# Patient Record
Sex: Female | Born: 1960 | Race: White | Hispanic: No | Marital: Married | Smoking: Never smoker
Health system: Southern US, Community
[De-identification: ages and names within clinical notes are randomized; demographics above are authoritative.]

## PROBLEM LIST (undated history)

## (undated) DIAGNOSIS — E079 Disorder of thyroid, unspecified: Secondary | ICD-10-CM

## (undated) HISTORY — PX: THYROID SURGERY: SHX805

## (undated) HISTORY — PX: ANKLE SURGERY: SHX546

## (undated) HISTORY — PX: TUBAL LIGATION: SHX77

---

## 2008-10-02 ENCOUNTER — Observation Stay (HOSPITAL_COMMUNITY): Admission: EM | Admit: 2008-10-02 | Discharge: 2008-10-03 | Payer: Self-pay | Admitting: Emergency Medicine

## 2011-05-01 NOTE — Op Note (Signed)
NAMETIFFNY, GEMMER NO.:  [id number]   MEDICAL RECORD NO.:  [id number]          PATIENT TYPE:  INP   LOCATION:  5011                         FACILITY:  [facility]   PHYSICIAN:  Harvie Junior, M.D.   DATE OF BIRTH:  Apr 15, 1961   DATE OF PROCEDURE:  10/03/2008  DATE OF DISCHARGE:  10/03/2008                               OPERATIVE REPORT   This is a 50 year old female on the Orthopedic Surgery Service.   PREOPERATIVE DIAGNOSIS:  Severely comminuted distal tibia fracture with  medial malleolus component, posterior malleolus component, and lateral  malleolar component.   POSTOPERATIVE DIAGNOSIS:  Severely comminuted distal tibia fracture with  medial malleolus component posterior malleolus component, and lateral  malleolar component.   PROCEDURE:  Open reduction and internal fixation of the trimalleolar  ankle fracture with fixation of the medial and lateral malleolus.   SURGEON:  Harvie Junior, MD   ASSISTANT:  Marshia Ly, PA   ANESTHESIA:  General.   BRIEF HISTORY:  Ms. Legendre is a 50 year old female with a long history  of tripping off her porch, suffered a severely comminuted distal ankle  fracture.  She was seen in the ER where they did an __________ close  reduction.  X-rays pre and post were evaluated and showed that she did  have both medial malleolus, lateral malleolus, and a posterior malleolar  __________, so she was taken to the operating room for open reduction  __________.   PROCEDURE:  The patient was taken to the operating room.  After adequate  anesthesia obtained with general anesthetic, the patient was placed  supine on the operating table.  The left leg was prepped and draped in  usual sterile fashion.  Following this, routine evaluation of the leg  revealed there was obvious lateral malleolus fracture.  We tried to do a  manipulative closed reduction and we had very difficult time getting it  reduced.  There was like a coronal strip  of this lateral malleolus  __________ malleolus and it was very mobile, really could not get a good  reduction.  Went medially and did an anatomic reduction of the medial  malleolus, came back laterally and were able to get an anatomic  reduction of lateral malleolus, posterior malleolus came in line at this  point. We used an 8-hole, 1/3 tubular plate proximally and paralleled  __________ 15 mm x 1.0 screws on the medial side.  Again, once we had  fixation there, we ultimately fixed the lateral malleolus and then under  fluoroscopic imaging looked at the posterior malleolus and it was about  20% of the joint, but ultimately we were able to come down low and held  it reduced especially with the extension of the great toe.  At this  point, the wounds were copiously  and thoroughly irrigated and suctioned  dried.  The skin was closed with a combination of 0 and 2-0 Vicryl and  skin staples.  The sterile compression dressing was applied as well as a  U and posterior splint.  The splint that came all the way out so as  to  hold the great toe in extension.  She was then taken to recovery and was  noted to be in satisfactory condition.  Fluoro was used throughout the case to check the reduction and as well  as screw lengths.  At this point, the patient was taken to the recovery  and she was noted to be in satisfactory condition.   ESTIMATED BLOOD LOSS:  __________.      Harvie Junior, M.D.  Electronically Signed     JLG/MEDQ  D:  10/03/2008  T:  10/03/2008  Job:  [id number]   cc:   Harvie Junior, M.D.

## 2011-06-15 DIAGNOSIS — M542 Cervicalgia: Secondary | ICD-10-CM | POA: Insufficient documentation

## 2011-09-18 LAB — CBC
Hemoglobin: 12.3
MCHC: 33.8
MCV: 90.1
Platelets: 333
RBC: 4.06
RDW: 13.3
WBC: 10.4

## 2011-09-18 LAB — BASIC METABOLIC PANEL
BUN: 14
CO2: 24
Chloride: 105
Creatinine, Ser: 0.74
Sodium: 137

## 2013-09-02 DIAGNOSIS — M79609 Pain in unspecified limb: Secondary | ICD-10-CM | POA: Insufficient documentation

## 2013-09-21 DIAGNOSIS — G5600 Carpal tunnel syndrome, unspecified upper limb: Secondary | ICD-10-CM | POA: Insufficient documentation

## 2014-06-28 DIAGNOSIS — M546 Pain in thoracic spine: Secondary | ICD-10-CM | POA: Insufficient documentation

## 2015-02-01 DIAGNOSIS — R9431 Abnormal electrocardiogram [ECG] [EKG]: Secondary | ICD-10-CM | POA: Insufficient documentation

## 2015-12-28 DIAGNOSIS — M5416 Radiculopathy, lumbar region: Secondary | ICD-10-CM | POA: Insufficient documentation

## 2016-02-15 DIAGNOSIS — R519 Headache, unspecified: Secondary | ICD-10-CM | POA: Insufficient documentation

## 2016-12-07 DIAGNOSIS — M79605 Pain in left leg: Secondary | ICD-10-CM | POA: Insufficient documentation

## 2016-12-07 DIAGNOSIS — R202 Paresthesia of skin: Secondary | ICD-10-CM | POA: Insufficient documentation

## 2017-02-26 DIAGNOSIS — E213 Hyperparathyroidism, unspecified: Secondary | ICD-10-CM

## 2017-02-26 DIAGNOSIS — E559 Vitamin D deficiency, unspecified: Secondary | ICD-10-CM

## 2017-02-26 DIAGNOSIS — I10 Essential (primary) hypertension: Secondary | ICD-10-CM

## 2017-02-26 HISTORY — DX: Hypercalcemia: E83.52

## 2017-02-26 HISTORY — DX: Hyperparathyroidism, unspecified: E21.3

## 2017-02-26 HISTORY — DX: Vitamin D deficiency, unspecified: E55.9

## 2017-02-26 HISTORY — DX: Essential (primary) hypertension: I10

## 2018-05-23 DIAGNOSIS — R252 Cramp and spasm: Secondary | ICD-10-CM | POA: Insufficient documentation

## 2018-09-19 DIAGNOSIS — M545 Low back pain, unspecified: Secondary | ICD-10-CM | POA: Insufficient documentation

## 2020-02-08 DIAGNOSIS — M51369 Other intervertebral disc degeneration, lumbar region without mention of lumbar back pain or lower extremity pain: Secondary | ICD-10-CM | POA: Insufficient documentation

## 2020-02-08 DIAGNOSIS — M5136 Other intervertebral disc degeneration, lumbar region: Secondary | ICD-10-CM

## 2020-02-08 HISTORY — DX: Other intervertebral disc degeneration, lumbar region without mention of lumbar back pain or lower extremity pain: M51.369

## 2020-02-08 HISTORY — DX: Other intervertebral disc degeneration, lumbar region: M51.36

## 2020-09-20 DIAGNOSIS — U071 COVID-19: Secondary | ICD-10-CM | POA: Insufficient documentation

## 2020-09-27 ENCOUNTER — Encounter: Payer: Self-pay | Admitting: Cardiology

## 2020-09-27 ENCOUNTER — Ambulatory Visit (INDEPENDENT_AMBULATORY_CARE_PROVIDER_SITE_OTHER): Payer: Commercial Managed Care - PPO | Admitting: Cardiology

## 2020-09-27 ENCOUNTER — Other Ambulatory Visit: Payer: Self-pay

## 2020-09-27 ENCOUNTER — Ambulatory Visit (INDEPENDENT_AMBULATORY_CARE_PROVIDER_SITE_OTHER): Payer: Commercial Managed Care - PPO

## 2020-09-27 VITALS — BP 130/72 | HR 44 | Ht 60.0 in | Wt 234.0 lb

## 2020-09-27 DIAGNOSIS — R600 Localized edema: Secondary | ICD-10-CM | POA: Diagnosis not present

## 2020-09-27 DIAGNOSIS — R079 Chest pain, unspecified: Secondary | ICD-10-CM

## 2020-09-27 DIAGNOSIS — I10 Essential (primary) hypertension: Secondary | ICD-10-CM

## 2020-09-27 DIAGNOSIS — R42 Dizziness and giddiness: Secondary | ICD-10-CM

## 2020-09-27 MED ORDER — NITROGLYCERIN 0.4 MG SL SUBL: 0.4000 mg | SUBLINGUAL_TABLET | SUBLINGUAL | 11 refills | Status: DC | PRN

## 2020-09-27 MED ORDER — POTASSIUM CHLORIDE CRYS ER 20 MEQ PO TBCR: EXTENDED_RELEASE_TABLET | ORAL | 3 refills | Status: DC

## 2020-09-27 MED ORDER — FUROSEMIDE 20 MG PO TABS: ORAL_TABLET | ORAL | 3 refills | Status: DC

## 2020-09-27 MED ORDER — NITROGLYCERIN 0.4 MG SL SUBL: 0.4000 mg | SUBLINGUAL_TABLET | SUBLINGUAL | 11 refills | Status: AC | PRN

## 2020-09-27 NOTE — Progress Notes (Signed)
Cardiology Office Note:    Date:  09/27/2020   ID:  Cynthia Archer, DOB 1961-02-01, MRN [id number]  PCP:  Imagene Riches, NP  Cardiologist:  Berniece Salines, DO  Electrophysiologist:  None   Referring MD: No ref. provider found   " I have been short of breath"   History of Present Illness:    Cynthia Archer is a 59 y.o. female with a hx of hypertension, obesity, presents today to establish cardiac care.  The patient initially presented to [facility] to be evaluated for Dobutamine stress echo. On presentation to the hospital she was noted to be in Mobitz I second degree AV block, at that time she also reported that she was experiencing some shortness of breath and lightheadedness.   The patient is here to establish cardiac care.  The patient tells me that she has been experiencing intermittent chest pressure.  She reports that she is experiencing this chest pressure on exertion.  Does not matter what she is doing walking to the bathroom she will feel this.  When she sits down it improves her stress.  In terms of her shortness of breath she notes that moving around worsen her shortness of breath as well.  She has had recently increasing bilateral leg edema since her PCP stopped her diuretics.  In addition she reports that lightheadedness and dizziness has been a problem for her.  She is concerned about this.   She is not experiencing any chest pain at this time.  Past Medical History:  Diagnosis Date  . Essential hypertension 02/26/2017  . Hypercalcemia 02/26/2017  . Hyperparathyroidism ([place]) 02/26/2017  . Vitamin D deficiency 02/26/2017    Past Surgical History:  Procedure Laterality Date  . ANKLE SURGERY    . THYROID SURGERY    . TUBAL LIGATION      Current Medications: Current Meds  Medication Sig  . buPROPion (WELLBUTRIN SR) 150 MG 12 hr tablet   . levothyroxine (SYNTHROID) 50 MCG tablet Take 50 mcg by mouth daily.  Marland Kitchen olmesartan (BENICAR) 20 MG tablet Take 20 mg by mouth  daily.  Marland Kitchen omeprazole (PRILOSEC) 40 MG capsule Take 40 mg by mouth daily.  . [DISCONTINUED] D3-50 1.25 MG (50000 UT) capsule Take by mouth.  . [DISCONTINUED] rosuvastatin (CRESTOR) 10 MG tablet Take 10 mg by mouth at bedtime.     Allergies:   Patient has no known allergies.   Social History   Socioeconomic History  . Marital status: Married    Spouse name: Not on file  . Number of children: Not on file  . Years of education: Not on file  . Highest education level: Not on file  Occupational History  . Not on file  Tobacco Use  . Smoking status: Never Smoker  . Smokeless tobacco: Never Used  Substance and Sexual Activity  . Alcohol use: Not Currently  . Drug use: Never  . Sexual activity: Not on file  Other Topics Concern  . Not on file  Social History Narrative  . Not on file   Social Determinants of Health   Financial Resource Strain:   . Difficulty of Paying Living Expenses: Not on file  Food Insecurity:   . Worried About Charity fundraiser in the Last Year: Not on file  . Ran Out of Food in the Last Year: Not on file  Transportation Needs:   . Lack of Transportation (Medical): Not on file  . Lack of Transportation (Non-Medical): Not on file  Physical Activity:   . Days of Exercise per Week: Not on file  . Minutes of Exercise per Session: Not on file  Stress:   . Feeling of Stress : Not on file  Social Connections:   . Frequency of Communication with Friends and Family: Not on file  . Frequency of Social Gatherings with Friends and Family: Not on file  . Attends Religious Services: Not on file  . Active Member of Clubs or Organizations: Not on file  . Attends Archivist Meetings: Not on file  . Marital Status: Not on file     Family History: The patient's family history includes Alzheimer's disease in her maternal grandmother; COPD in her father; Heart disease in her mother; Kidney failure in her mother; Lung cancer in her father, maternal  grandfather, and paternal grandfather.  ROS:   Review of Systems  Constitution: Negative for decreased appetite, fever and weight gain.  HENT: Negative for congestion, ear discharge, hoarse voice and sore throat.   Eyes: Negative for discharge, redness, vision loss in right eye and visual halos.  Cardiovascular: Negative for chest pain, dyspnea on exertion, leg swelling, orthopnea and palpitations.  Respiratory: Negative for cough, hemoptysis, shortness of breath and snoring.   Endocrine: Negative for heat intolerance and polyphagia.  Hematologic/Lymphatic: Negative for bleeding problem. Does not bruise/bleed easily.  Skin: Negative for flushing, nail changes, rash and suspicious lesions.  Musculoskeletal: Negative for arthritis, joint pain, muscle cramps, myalgias, neck pain and stiffness.  Gastrointestinal: Negative for abdominal pain, bowel incontinence, diarrhea and excessive appetite.  Genitourinary: Negative for decreased libido, genital sores and incomplete emptying.  Neurological: Negative for brief paralysis, focal weakness, headaches and loss of balance.  Psychiatric/Behavioral: Negative for altered mental status, depression and suicidal ideas.  Allergic/Immunologic: Negative for HIV exposure and persistent infections.    EKGs/Labs/Other Studies Reviewed:    The following studies were reviewed today:   EKG:  The ekg ordered today demonstrates sinus bradycardia, heart rate 52 bpm with Mobitz type I AV block and rare PAC.   Echocardiogram done during hospital today shows EF 60 to 65%.   Recent Labs: No results found for requested labs within last 8760 hours.  Recent Lipid Panel No results found for: CHOL, TRIG, HDL, CHOLHDL, VLDL, LDLCALC, LDLDIRECT  Physical Exam:    VS:  BP 130/72 (BP Location: Left Arm, Patient Position: Sitting, Cuff Size: Normal)   Pulse (!) 44   Ht 5' (1.524 m)   Wt 234 lb (106.1 kg)   SpO2 94%   BMI 45.70 kg/m     Wt Readings from Last 3  Encounters:  09/27/20 234 lb (106.1 kg)     GEN: Well nourished, well developed in no acute distress HEENT: Normal NECK: No JVD; No carotid bruits LYMPHATICS: No lymphadenopathy CARDIAC: S1S2 noted,RRR, no murmurs, rubs, gallops RESPIRATORY:  Clear to auscultation without rales, wheezing or rhonchi  ABDOMEN: Soft, non-tender, non-distended, +bowel sounds, no guarding. EXTREMITIES: +2 bilateral leg edema edema, No cyanosis, no clubbing MUSCULOSKELETAL:  No deformity  SKIN: Warm and dry NEUROLOGIC:  Alert and oriented x 3, non-focal PSYCHIATRIC:  Normal affect, good insight  ASSESSMENT:    1. Chest pain of uncertain etiology   2. Dizziness   3. Essential hypertension   4. Bilateral leg edema    PLAN:     Echocardiogram is normal ef and no significant valvular abnormalities.  No evidence of any cardiomyopathy.  In the setting of her type I second-degree AV block and persistent  bradycardia I like to place a monitor on the patient to understand more about her rhythm.  Bilateral leg edema - she will benefit from cautious diuretics  In terms of her chest pain I recommend a Cardiac ct for evaluation of her coronary. She does intermediate risk for CAD. She had not IV dye allergy. We will work to expedite the patient test.   Blood work will be done today   The patient is in agreement with the above plan. The patient left the office in stable condition.  The patient will follow up in 8 weeks or sooner if needed.   Medication Adjustments/Labs and Tests Ordered: Current medicines are reviewed at length with the patient today.  Concerns regarding medicines are outlined above.  Orders Placed This Encounter  Procedures  . CT CORONARY MORPH W/CTA COR W/SCORE W/CA W/CM &/OR WO/CM  . CT CORONARY FRACTIONAL FLOW RESERVE DATA PREP  . CT CORONARY FRACTIONAL FLOW RESERVE FLUID ANALYSIS  . Basic metabolic panel  . Magnesium  . CBC  . TSH  . Pro b natriuretic peptide (BNP)  . LONG TERM  MONITOR (3-14 DAYS)   Meds ordered this encounter  Medications  . DISCONTD: nitroGLYCERIN (NITROSTAT) 0.4 MG SL tablet    Sig: Place 1 tablet (0.4 mg total) under the tongue every 5 (five) minutes as needed for chest pain.    Dispense:  25 tablet    Refill:  11  . DISCONTD: furosemide (LASIX) 20 MG tablet    Sig: Take one tablet on tuesdays, thursdays, and saturdays.    Dispense:  30 tablet    Refill:  3  . DISCONTD: potassium chloride SA (KLOR-CON) 20 MEQ tablet    Sig: Take one tablet on Tuesday, Thursday, and Saturday.    Dispense:  30 tablet    Refill:  3  . furosemide (LASIX) 20 MG tablet    Sig: Take one tablet on tuesdays, thursdays, and saturdays.    Dispense:  30 tablet    Refill:  3  . nitroGLYCERIN (NITROSTAT) 0.4 MG SL tablet    Sig: Place 1 tablet (0.4 mg total) under the tongue every 5 (five) minutes as needed for chest pain.    Dispense:  25 tablet    Refill:  11  . potassium chloride SA (KLOR-CON) 20 MEQ tablet    Sig: Take one tablet on Tuesday, Thursday, and Saturday.    Dispense:  30 tablet    Refill:  3    Patient Instructions  Medication Instructions:  Your physician has recommended you make the following change in your medication:   TAKE AS NEEDED FOR CHEST PAIN: Nitroglycerin 0.4 mg sublingual (under your tongue) as needed for chest pain. If experiencing chest pain, stop what you are doing and sit down. Take 1 nitroglycerin and wait 5 minutes. If chest pain continues, take another nitroglycerin and wait 5 minutes. If chest pain does not subside, take 1 more nitroglycerin and dial 911. You make take a total of 3 nitroglycerin in a 15 minute time frame.   START: lasix 20 mg on tuesdays, thursdays, and saturdays   START: Potassium 20 meq tuesdays, thursdays, and saturdays.    *If you need a refill on your cardiac medications before your next appointment, please call your pharmacy*   Lab Work: Your physician recommends that you return for lab work  today: bmp, mg, cbc, tsh, and pro bnp   If you have labs (blood work) drawn today and your tests are completely normal,  you will receive your results only by: Marland Kitchen MyChart Message (if you have MyChart) OR . A paper copy in the mail If you have any lab test that is abnormal or we need to change your treatment, we will call you to review the results.   Testing/Procedures:  A zio monitor was ordered today. It will remain on for 7 days. You will then return monitor and event diary in provided box. It takes 1-2 weeks for report to be downloaded and returned to [place]. We will call you with the results. If monitor falls off or has orange flashing light, please call Zio for further instructions.    Your cardiac CT will be scheduled at one of the below locations:   [facility] [address] [place], [place] [address] [phone]  [facility] [address] [place],  [address] [phone]  If scheduled at [facility], please arrive at the [facility] main entrance of [facility] 30 minutes prior to test start time. Proceed to the [facility] Radiology Department (first floor) to check-in and test prep.  If scheduled at [facility], please arrive 15 mins early for check-in and test prep.  Please follow these instructions carefully (unless otherwise directed):    On the Night Before the Test: . Be sure to Drink plenty of water. . Do not consume any caffeinated/decaffeinated beverages or chocolate 12 hours prior to your test. . Do not take any antihistamines 12 hours prior to your test.   On the Day of the Test: . Drink plenty of water. Do not drink any water within one hour of the test. . Do not eat any food 4 hours prior to the test. . You may take your regular medications prior to the test.  . HOLD lasix the morning of the test.  . FEMALES- please wear underwire-free bra if  available          After the Test: . Drink plenty of water. . After receiving IV contrast, you may experience a mild flushed feeling. This is normal. . On occasion, you may experience a mild rash up to 24 hours after the test. This is not dangerous. If this occurs, you can take Benadryl 25 mg and increase your fluid intake. . If you experience trouble breathing, this can be serious. If it is severe call 911 IMMEDIATELY. If it is mild, please call our office. . If you take any of these medications: Glipizide/Metformin, Avandament, Glucavance, please do not take 48 hours after completing test unless otherwise instructed.   Once we have confirmed authorization from your insurance company, we will call you to set up a date and time for your test. Based on how quickly your insurance processes prior authorizations requests, please allow up to 4 weeks to be contacted for scheduling your Cardiac CT appointment. Be advised that routine Cardiac CT appointments could be scheduled as many as 8 weeks after your provider has ordered it.  For non-scheduling related questions, please contact the cardiac imaging nurse navigator should you have any questions/concerns: Marchia Bond, Cardiac Imaging Nurse Navigator Burley Saver, Interim Cardiac Imaging Nurse [place] [facility] Direct Office Dial: [phone]   For scheduling needs, including cancellations and rescheduling, please call Vivien Rota at [phone], option [phone].     Follow-Up: At [facility], you and your health needs are our priority.  As part of our continuing mission to provide you  with exceptional heart care, we have created designated Provider Care Teams.  These Care Teams include your primary Cardiologist (physician) and Advanced Practice Providers (APPs -  Physician Assistants and Nurse Practitioners) who all work together to provide you with the care you need, when you need it.  We recommend signing up for the  patient portal called "MyChart".  Sign up information is provided on this After Visit Summary.  MyChart is used to connect with patients for Virtual Visits (Telemedicine).  Patients are able to view lab/test results, encounter notes, upcoming appointments, etc.  Non-urgent messages can be sent to your provider as well.   To learn more about what you can do with MyChart, go to [email or web address].    Your next appointment:   8 week(s)  The format for your next appointment:   In Person  Provider:   Berniece Salines, DO   Other Instructions   Cardiac CT Angiogram A cardiac CT angiogram is a procedure to look at the heart and the area around the heart. It may be done to help find the cause of chest pains or other symptoms of heart disease. During this procedure, a substance called contrast dye is injected into the blood vessels in the area to be checked. A large X-ray machine, called a CT scanner, then takes detailed pictures of the heart and the surrounding area. The procedure is also sometimes called a coronary CT angiogram, coronary artery scanning, or CTA. A cardiac CT angiogram allows the health care provider to see how well blood is flowing to and from the heart. The health care provider will be able to see if there are any problems, such as:  Blockage or narrowing of the coronary arteries in the heart.  Fluid around the heart.  Signs of weakness or disease in the muscles, valves, and tissues of the heart. Tell a health care provider about:  Any allergies you have. This is especially important if you have had a previous allergic reaction to contrast dye.  All medicines you are taking, including vitamins, herbs, eye drops, creams, and over-the-counter medicines.  Any blood disorders you have.  Any surgeries you have had.  Any medical conditions you have.  Whether you are pregnant or may be pregnant.  Any anxiety disorders, chronic pain, or other conditions you have that may  increase your stress or prevent you from lying still. What are the risks? Generally, this is a safe procedure. However, problems may occur, including:  Bleeding.  Infection.  Allergic reactions to medicines or dyes.  Damage to other structures or organs.  Kidney damage from the contrast dye that is used.  Increased risk of cancer from radiation exposure. This risk is low. Talk with your health care provider about: ? The risks and benefits of testing. ? How you can receive the lowest dose of radiation. What happens before the procedure?  Wear comfortable clothing and remove any jewelry, glasses, dentures, and hearing aids.  Follow instructions from your health care provider about eating and drinking. This may include: ? For 12 hours before the procedure -- avoid caffeine. This includes tea, coffee, soda, energy drinks, and diet pills. Drink plenty of water or other fluids that do not have caffeine in them. Being well hydrated can prevent complications. ? For 4-6 hours before the procedure -- stop eating and drinking. The contrast dye can cause nausea, but this is less likely if your stomach is empty.  Ask your health care provider about changing or stopping your regular  medicines. This is especially important if you are taking diabetes medicines, blood thinners, or medicines to treat problems with erections (erectile dysfunction). What happens during the procedure?   Hair on your chest may need to be removed so that small sticky patches called electrodes can be placed on your chest. These will transmit information that helps to monitor your heart during the procedure.  An IV will be inserted into one of your veins.  You might be given a medicine to control your heart rate during the procedure. This will help to ensure that good images are obtained.  You will be asked to lie on an exam table. This table will slide in and out of the CT machine during the procedure.  Contrast dye  will be injected into the IV. You might feel warm, or you may get a metallic taste in your mouth.  You will be given a medicine called nitroglycerin. This will relax or dilate the arteries in your heart.  The table that you are lying on will move into the CT machine tunnel for the scan.  The person running the machine will give you instructions while the scans are being done. You may be asked to: ? Keep your arms above your head. ? Hold your breath. ? Stay very still, even if the table is moving.  When the scanning is complete, you will be moved out of the machine.  The IV will be removed. The procedure may vary among health care providers and hospitals. What can I expect after the procedure? After your procedure, it is common to have:  A metallic taste in your mouth from the contrast dye.  A feeling of warmth.  A headache from the nitroglycerin. Follow these instructions at home:  Take over-the-counter and prescription medicines only as told by your health care provider.  If you are told, drink enough fluid to keep your urine pale yellow. This will help to flush the contrast dye out of your body.  Most people can return to their normal activities right after the procedure. Ask your health care provider what activities are safe for you.  It is up to you to get the results of your procedure. Ask your health care provider, or the department that is doing the procedure, when your results will be ready.  Keep all follow-up visits as told by your health care provider. This is important. Contact a health care provider if:  You have any symptoms of allergy to the contrast dye. These include: ? Shortness of breath. ? Rash or hives. ? A racing heartbeat. Summary  A cardiac CT angiogram is a procedure to look at the heart and the area around the heart. It may be done to help find the cause of chest pains or other symptoms of heart disease.  During this procedure, a large X-ray  machine, called a CT scanner, takes detailed pictures of the heart and the surrounding area after a contrast dye has been injected into blood vessels in the area.  Ask your health care provider about changing or stopping your regular medicines before the procedure. This is especially important if you are taking diabetes medicines, blood thinners, or medicines to treat erectile dysfunction.  If you are told, drink enough fluid to keep your urine pale yellow. This will help to flush the contrast dye out of your body. This information is not intended to replace advice given to you by your health care provider. Make sure you discuss any questions you have  with your health care provider. Document Revised: 07/29/2019 Document Reviewed: 07/29/2019 Elsevier Patient Education  [employer].  Nitroglycerin sublingual tablets What is this medicine? NITROGLYCERIN (nye troe GLI ser in) is a type of vasodilator. It relaxes blood vessels, increasing the blood and oxygen supply to your heart. This medicine is used to relieve chest pain caused by angina. It is also used to prevent chest pain before activities like climbing stairs, going outdoors in cold weather, or sexual activity. This medicine may be used for other purposes; ask your health care provider or pharmacist if you have questions. COMMON BRAND NAME(S): Nitroquick, Nitrostat, Nitrotab What should I tell my health care provider before I take this medicine? They need to know if you have any of these conditions:  anemia  head injury, recent stroke, or bleeding in the brain  liver disease  previous heart attack  an unusual or allergic reaction to nitroglycerin, other medicines, foods, dyes, or preservatives  pregnant or trying to get pregnant  breast-feeding How should I use this medicine? Take this medicine by mouth as needed. At the first sign of an angina attack (chest pain or tightness) place one tablet under your tongue. You can also  take this medicine 5 to 10 minutes before an event likely to produce chest pain. Follow the directions on the prescription label. Let the tablet dissolve under the tongue. Do not swallow whole. Replace the dose if you accidentally swallow it. It will help if your mouth is not dry. Saliva around the tablet will help it to dissolve more quickly. Do not eat or drink, smoke or chew tobacco while a tablet is dissolving. If you are not better within 5 minutes after taking ONE dose of nitroglycerin, call 9-1-1 immediately to seek emergency medical care. Do not take more than 3 nitroglycerin tablets over 15 minutes. If you take this medicine often to relieve symptoms of angina, your doctor or health care professional may provide you with different instructions to manage your symptoms. If symptoms do not go away after following these instructions, it is important to call 9-1-1 immediately. Do not take more than 3 nitroglycerin tablets over 15 minutes. Talk to your pediatrician regarding the use of this medicine in children. Special care may be needed. Overdosage: If you think you have taken too much of this medicine contact a poison control center or emergency room at once. NOTE: This medicine is only for you. Do not share this medicine with others. What if I miss a dose? This does not apply. This medicine is only used as needed. What may interact with this medicine? Do not take this medicine with any of the following medications:  certain migraine medicines like ergotamine and dihydroergotamine (DHE)  medicines used to treat erectile dysfunction like sildenafil, tadalafil, and vardenafil  riociguat This medicine may also interact with the following medications:  alteplase  aspirin  heparin  medicines for high blood pressure  medicines for mental depression  other medicines used to treat angina  phenothiazines like chlorpromazine, mesoridazine, prochlorperazine, thioridazine This list may not  describe all possible interactions. Give your health care provider a list of all the medicines, herbs, non-prescription drugs, or dietary supplements you use. Also tell them if you smoke, drink alcohol, or use illegal drugs. Some items may interact with your medicine. What should I watch for while using this medicine? Tell your doctor or health care professional if you feel your medicine is no longer working. Keep this medicine with you at all  times. Sit or lie down when you take your medicine to prevent falling if you feel dizzy or faint after using it. Try to remain calm. This will help you to feel better faster. If you feel dizzy, take several deep breaths and lie down with your feet propped up, or bend forward with your head resting between your knees. You may get drowsy or dizzy. Do not drive, use machinery, or do anything that needs mental alertness until you know how this drug affects you. Do not stand or sit up quickly, especially if you are an older patient. This reduces the risk of dizzy or fainting spells. Alcohol can make you more drowsy and dizzy. Avoid alcoholic drinks. Do not treat yourself for coughs, colds, or pain while you are taking this medicine without asking your doctor or health care professional for advice. Some ingredients may increase your blood pressure. What side effects may I notice from receiving this medicine? Side effects that you should report to your doctor or health care professional as soon as possible:  blurred vision  dry mouth  skin rash  sweating  the feeling of extreme pressure in the head  unusually weak or tired Side effects that usually do not require medical attention (report to your doctor or health care professional if they continue or are bothersome):  flushing of the face or neck  headache  irregular heartbeat, palpitations  nausea, vomiting This list may not describe all possible side effects. Call your doctor for medical advice about  side effects. You may report side effects to FDA at 1-800-FDA-1088. Where should I keep my medicine? Keep out of the reach of children. Store at room temperature between 20 and 25 degrees C (68 and 77 degrees F). Store in Retail buyer. Protect from light and moisture. Keep tightly closed. Throw away any unused medicine after the expiration date. NOTE: This sheet is a summary. It may not cover all possible information. If you have questions about this medicine, talk to your doctor, pharmacist, or health care provider.  2020 Elsevier/Gold Standard (2013-10-01 17:57:36)  Furosemide Oral Tablets What is this medicine? FUROSEMIDE (fyoor [place] se mide) is a diuretic. It helps you make more urine and to lose salt and excess water from your body. It treats swelling from heart, kidney, or liver disease. It also treats high blood pressure. This medicine may be used for other purposes; ask your health care provider or pharmacist if you have questions. COMMON BRAND NAME(S): Active-Medicated Specimen Kit, Delone, Diuscreen, Lasix, RX Specimen Collection Kit, Specimen Collection Kit, URINX Medicated Specimen Collection What should I tell my health care provider before I take this medicine? They need to know if you have any of these conditions:  abnormal blood electrolytes  diarrhea or vomiting  gout  heart disease  kidney disease, small amounts of urine, or difficulty passing urine  liver disease  thyroid disease  an unusual or allergic reaction to furosemide, sulfa drugs, other medicines, foods, dyes, or preservatives  pregnant or trying to get pregnant  breast-feeding How should I use this medicine? Take this drug by mouth. Take it as directed on the prescription label at the same time every day. You can take it with or without food. If it upsets your stomach, take it with food. Keep taking it unless your health care provider tells you to stop. Talk to your health care provider about the  use of this drug in children. Special care may be needed. Overdosage: If you think you have  taken too much of this medicine contact a poison control center or emergency room at once. NOTE: This medicine is only for you. Do not share this medicine with others. What if I miss a dose? If you miss a dose, take it as soon as you can. If it is almost time for your next dose, take only that dose. Do not take double or extra doses. What may interact with this medicine?  aspirin and aspirin-like medicines  certain antibiotics  chloral hydrate  cisplatin  cyclosporine  digoxin  diuretics  laxatives  lithium  medicines for blood pressure  medicines that relax muscles for surgery  methotrexate  NSAIDs, medicines for pain and inflammation like ibuprofen, naproxen, or indomethacin  phenytoin  steroid medicines like prednisone or cortisone  sucralfate  thyroid hormones This list may not describe all possible interactions. Give your health care provider a list of all the medicines, herbs, non-prescription drugs, or dietary supplements you use. Also tell them if you smoke, drink alcohol, or use illegal drugs. Some items may interact with your medicine. What should I watch for while using this medicine? Visit your doctor or health care provider for regular checks on your progress. Check your blood pressure regularly. Ask your doctor or health care provider what your blood pressure should be, and when you should contact him or her. If you are a diabetic, check your blood sugar as directed. This medicine may cause serious skin reactions. They can happen weeks to months after starting the medicine. Contact your health care provider right away if you notice fevers or flu-like symptoms with a rash. The rash may be red or purple and then turn into blisters or peeling of the skin. Or, you might notice a red rash with swelling of the face, lips or lymph nodes in your neck or under your arms. You  may need to be on a special diet while taking this medicine. Check with your doctor. Also, ask how many glasses of fluid you need to drink a day. You must not get dehydrated. You may get drowsy or dizzy. Do not drive, use machinery, or do anything that needs mental alertness until you know how this drug affects you. Do not stand or sit up quickly, especially if you are an older patient. This reduces the risk of dizzy or fainting spells. Alcohol can make you more drowsy and dizzy. Avoid alcoholic drinks. This medicine can make you more sensitive to the sun. Keep out of the sun. If you cannot avoid being in the sun, wear protective clothing and use sunscreen. Do not use sun lamps or tanning beds/booths. What side effects may I notice from receiving this medicine? Side effects that you should report to your doctor or health care professional as soon as possible:  blood in urine or stools  dry mouth  fever or chills  hearing loss or ringing in the ears  irregular heartbeat  muscle pain or weakness, cramps  rash, fever, and swollen lymph nodes  redness, blistering, peeling or loosening of the skin, including inside the mouth  skin rash  stomach upset, pain, or nausea  tingling or numbness in the hands or feet  unusually weak or tired  vomiting or diarrhea  yellowing of the eyes or skin Side effects that usually do not require medical attention (report to your doctor or health care professional if they continue or are bothersome):  headache  loss of appetite  unusual bleeding or bruising This list may not describe all  possible side effects. Call your doctor for medical advice about side effects. You may report side effects to FDA at 1-800-FDA-1088. Where should I keep my medicine? Keep out of the reach of children and pets. Store at room temperature between 20 and 25 degrees C (68 and 77 degrees F). Protect from light and moisture. Keep the container tightly closed. Throw away  any unused drug after the expiration date. NOTE: This sheet is a summary. It may not cover all possible information. If you have questions about this medicine, talk to your doctor, pharmacist, or health care provider.  2020 Elsevier/Gold Standard (2019-07-21 18:01:32)     Adopting a Healthy Lifestyle.  Know what a healthy weight is for you (roughly BMI <25) and aim to maintain this   Aim for 7+ servings of fruits and vegetables daily   65-80+ fluid ounces of water or unsweet tea for healthy kidneys   Limit to max 1 drink of alcohol per day; avoid smoking/tobacco   Limit animal fats in diet for cholesterol and heart health - choose grass fed whenever available   Avoid highly processed foods, and foods high in saturated/trans fats   Aim for low stress - take time to unwind and care for your mental health   Aim for 150 min of moderate intensity exercise weekly for heart health, and weights twice weekly for bone health   Aim for 7-9 hours of sleep daily   When it comes to diets, agreement about the perfect plan isnt easy to find, even among the experts. Experts at the [employer] developed an idea known as the Healthy Eating Plate. Just imagine a plate divided into logical, healthy portions.   The emphasis is on diet quality:   Load up on vegetables and fruits - one-half of your plate: Aim for color and variety, and remember that potatoes dont count.   Go for whole grains - one-quarter of your plate: Whole wheat, barley, wheat berries, quinoa, oats, brown rice, and foods made with them. If you want pasta, go with whole wheat pasta.   Protein power - one-quarter of your plate: Fish, chicken, beans, and nuts are all healthy, versatile protein sources. Limit red meat.   The diet, however, does go beyond the plate, offering a few other suggestions.   Use healthy plant oils, such as olive, canola, soy, corn, sunflower and peanut. Check the labels, and avoid  partially hydrogenated oil, which have unhealthy trans fats.   If youre thirsty, drink water. Coffee and tea are good in moderation, but skip sugary drinks and limit milk and dairy products to one or two daily servings.   The type of carbohydrate in the diet is more important than the amount. Some sources of carbohydrates, such as vegetables, fruits, whole grains, and beans-are healthier than others.   Finally, stay active  Signed, Berniece Salines, DO  09/27/2020 8:35 PM    [facility]

## 2020-09-27 NOTE — Patient Instructions (Addendum)
Medication Instructions:  Your physician has recommended you make the following change in your medication:   TAKE AS NEEDED FOR CHEST PAIN: Nitroglycerin 0.4 mg sublingual (under your tongue) as needed for chest pain. If experiencing chest pain, stop what you are doing and sit down. Take 1 nitroglycerin and wait 5 minutes. If chest pain continues, take another nitroglycerin and wait 5 minutes. If chest pain does not subside, take 1 more nitroglycerin and dial 911. You make take a total of 3 nitroglycerin in a 15 minute time frame.   START: lasix 20 mg on tuesdays, thursdays, and saturdays   START: Potassium 20 meq tuesdays, thursdays, and saturdays.    *If you need a refill on your cardiac medications before your next appointment, please call your pharmacy*   Lab Work: Your physician recommends that you return for lab work today: bmp, mg, cbc, tsh, and pro bnp   If you have labs (blood work) drawn today and your tests are completely normal, you will receive your results only by: Marland Kitchen MyChart Message (if you have MyChart) OR . A paper copy in the mail If you have any lab test that is abnormal or we need to change your treatment, we will call you to review the results.   Testing/Procedures:  A zio monitor was ordered today. It will remain on for 7 days. You will then return monitor and event diary in provided box. It takes 1-2 weeks for report to be downloaded and returned to [place]. We will call you with the results. If monitor falls off or has orange flashing light, please call Zio for further instructions.    Your cardiac CT will be scheduled at one of the below locations:   [facility] [address] [place], [place] [address] [phone]  OR  [facility] [address] [place], [place] [address] [phone]  If scheduled at [facility], please arrive at the [facility] main entrance of [facility] 30  minutes prior to test start time. Proceed to the [facility] Radiology Department (first floor) to check-in and test prep.  If scheduled at [facility], please arrive 15 mins early for check-in and test prep.  Please follow these instructions carefully (unless otherwise directed):    On the Night Before the Test: . Be sure to Drink plenty of water. . Do not consume any caffeinated/decaffeinated beverages or chocolate 12 hours prior to your test. . Do not take any antihistamines 12 hours prior to your test.   On the Day of the Test: . Drink plenty of water. Do not drink any water within one hour of the test. . Do not eat any food 4 hours prior to the test. . You may take your regular medications prior to the test.  . HOLD lasix the morning of the test.  . FEMALES- please wear underwire-free bra if available          After the Test: . Drink plenty of water. . After receiving IV contrast, you may experience a mild flushed feeling. This is normal. . On occasion, you may experience a mild rash up to 24 hours after the test. This is not dangerous. If this occurs, you can take Benadryl 25 mg and increase your fluid intake. . If you experience trouble breathing, this can be serious. If it is severe call 911 IMMEDIATELY. If it is mild, please call our office. . If you take any of  these medications: Glipizide/Metformin, Avandament, Glucavance, please do not take 48 hours after completing test unless otherwise instructed.   Once we have confirmed authorization from your insurance company, we will call you to set up a date and time for your test. Based on how quickly your insurance processes prior authorizations requests, please allow up to 4 weeks to be contacted for scheduling your Cardiac CT appointment. Be advised that routine Cardiac CT appointments could be scheduled as many as 8 weeks after your provider has ordered it.  For non-scheduling related questions,  please contact the cardiac imaging nurse navigator should you have any questions/concerns: Rockwell Alexandria, Cardiac Imaging Nurse Navigator Mitzi Hansen, Interim Cardiac Imaging Nurse Navigator  Heart and V[facility] Services Direct Office Dial: [phone]   For scheduling needs, including cancellations and rescheduling, please call Sheralyn Boatman at [phone], option 3.     Follow-Up: At [facility], you and your health needs are our priority.  As part of our continuing mission to provide you with exceptional heart care, we have created designated Provider Care Teams.  These Care Teams include your primary Cardiologist (physician) and Advanced Practice Providers (APPs -  Physician Assistants and Nurse Practitioners) who all work together to provide you with the care you need, when you need it.  We recommend signing up for the patient portal called "MyChart".  Sign up information is provided on this After Visit Summary.  MyChart is used to connect with patients for Virtual Visits (Telemedicine).  Patients are able to view lab/test results, encounter notes, upcoming appointments, etc.  Non-urgent messages can be sent to your provider as well.   To learn more about what you can do with MyChart, go to [email or web address].    Your next appointment:   8 week(s)  The format for your next appointment:   In Person  Provider:   Thomasene Ripple, DO   Other Instructions   Cardiac CT Angiogram A cardiac CT angiogram is a procedure to look at the heart and the area around the heart. It may be done to help find the cause of chest pains or other symptoms of heart disease. During this procedure, a substance called contrast dye is injected into the blood vessels in the area to be checked. A large X-ray machine, called a CT scanner, then takes detailed pictures of the heart and the surrounding area. The procedure is also sometimes called a coronary CT angiogram, coronary artery scanning, or CTA. A  cardiac CT angiogram allows the health care provider to see how well blood is flowing to and from the heart. The health care provider will be able to see if there are any problems, such as:  Blockage or narrowing of the coronary arteries in the heart.  Fluid around the heart.  Signs of weakness or disease in the muscles, valves, and tissues of the heart. Tell a health care provider about:  Any allergies you have. This is especially important if you have had a previous allergic reaction to contrast dye.  All medicines you are taking, including vitamins, herbs, eye drops, creams, and over-the-counter medicines.  Any blood disorders you have.  Any surgeries you have had.  Any medical conditions you have.  Whether you are pregnant or may be pregnant.  Any anxiety disorders, chronic pain, or other conditions you have that may increase your stress or prevent you from lying still. What are the risks? Generally, this is a safe procedure. However, problems may occur, including:  Bleeding.  Infection.  Allergic reactions  to medicines or dyes.  Damage to other structures or organs.  Kidney damage from the contrast dye that is used.  Increased risk of cancer from radiation exposure. This risk is low. Talk with your health care provider about: ? The risks and benefits of testing. ? How you can receive the lowest dose of radiation. What happens before the procedure?  Wear comfortable clothing and remove any jewelry, glasses, dentures, and hearing aids.  Follow instructions from your health care provider about eating and drinking. This may include: ? For 12 hours before the procedure -- avoid caffeine. This includes tea, coffee, soda, energy drinks, and diet pills. Drink plenty of water or other fluids that do not have caffeine in them. Being well hydrated can prevent complications. ? For 4-6 hours before the procedure -- stop eating and drinking. The contrast dye can cause nausea, but  this is less likely if your stomach is empty.  Ask your health care provider about changing or stopping your regular medicines. This is especially important if you are taking diabetes medicines, blood thinners, or medicines to treat problems with erections (erectile dysfunction). What happens during the procedure?   Hair on your chest may need to be removed so that small sticky patches called electrodes can be placed on your chest. These will transmit information that helps to monitor your heart during the procedure.  An IV will be inserted into one of your veins.  You might be given a medicine to control your heart rate during the procedure. This will help to ensure that good images are obtained.  You will be asked to lie on an exam table. This table will slide in and out of the CT machine during the procedure.  Contrast dye will be injected into the IV. You might feel warm, or you may get a metallic taste in your mouth.  You will be given a medicine called nitroglycerin. This will relax or dilate the arteries in your heart.  The table that you are lying on will move into the CT machine tunnel for the scan.  The person running the machine will give you instructions while the scans are being done. You may be asked to: ? Keep your arms above your head. ? Hold your breath. ? Stay very still, even if the table is moving.  When the scanning is complete, you will be moved out of the machine.  The IV will be removed. The procedure may vary among health care providers and hospitals. What can I expect after the procedure? After your procedure, it is common to have:  A metallic taste in your mouth from the contrast dye.  A feeling of warmth.  A headache from the nitroglycerin. Follow these instructions at home:  Take over-the-counter and prescription medicines only as told by your health care provider.  If you are told, drink enough fluid to keep your urine pale yellow. This will help  to flush the contrast dye out of your body.  Most people can return to their normal activities right after the procedure. Ask your health care provider what activities are safe for you.  It is up to you to get the results of your procedure. Ask your health care provider, or the department that is doing the procedure, when your results will be ready.  Keep all follow-up visits as told by your health care provider. This is important. Contact a health care provider if:  You have any symptoms of allergy to the contrast dye. These  include: ? Shortness of breath. ? Rash or hives. ? A racing heartbeat. Summary  A cardiac CT angiogram is a procedure to look at the heart and the area around the heart. It may be done to help find the cause of chest pains or other symptoms of heart disease.  During this procedure, a large X-ray machine, called a CT scanner, takes detailed pictures of the heart and the surrounding area after a contrast dye has been injected into blood vessels in the area.  Ask your health care provider about changing or stopping your regular medicines before the procedure. This is especially important if you are taking diabetes medicines, blood thinners, or medicines to treat erectile dysfunction.  If you are told, drink enough fluid to keep your urine pale yellow. This will help to flush the contrast dye out of your body. This information is not intended to replace advice given to you by your health care provider. Make sure you discuss any questions you have with your health care provider. Document Revised: 07/29/2019 Document Reviewed: 07/29/2019 Elsevier Patient Education  2020 Elsevier Inc.  Nitroglycerin sublingual tablets What is this medicine? NITROGLYCERIN (nye troe GLI ser in) is a type of vasodilator. It relaxes blood vessels, increasing the blood and oxygen supply to your heart. This medicine is used to relieve chest pain caused by angina. It is also used to prevent chest  pain before activities like climbing stairs, going outdoors in cold weather, or sexual activity. This medicine may be used for other purposes; ask your health care provider or pharmacist if you have questions. COMMON BRAND NAME(S): Nitroquick, Nitrostat, Nitrotab What should I tell my health care provider before I take this medicine? They need to know if you have any of these conditions:  anemia  head injury, recent stroke, or bleeding in the brain  liver disease  previous heart attack  an unusual or allergic reaction to nitroglycerin, other medicines, foods, dyes, or preservatives  pregnant or trying to get pregnant  breast-feeding How should I use this medicine? Take this medicine by mouth as needed. At the first sign of an angina attack (chest pain or tightness) place one tablet under your tongue. You can also take this medicine 5 to 10 minutes before an event likely to produce chest pain. Follow the directions on the prescription label. Let the tablet dissolve under the tongue. Do not swallow whole. Replace the dose if you accidentally swallow it. It will help if your mouth is not dry. Saliva around the tablet will help it to dissolve more quickly. Do not eat or drink, smoke or chew tobacco while a tablet is dissolving. If you are not better within 5 minutes after taking ONE dose of nitroglycerin, call 9-1-1 immediately to seek emergency medical care. Do not take more than 3 nitroglycerin tablets over 15 minutes. If you take this medicine often to relieve symptoms of angina, your doctor or health care professional may provide you with different instructions to manage your symptoms. If symptoms do not go away after following these instructions, it is important to call 9-1-1 immediately. Do not take more than 3 nitroglycerin tablets over 15 minutes. Talk to your pediatrician regarding the use of this medicine in children. Special care may be needed. Overdosage: If you think you have taken  too much of this medicine contact a poison control center or emergency room at once. NOTE: This medicine is only for you. Do not share this medicine with others. What if I miss a  dose? This does not apply. This medicine is only used as needed. What may interact with this medicine? Do not take this medicine with any of the following medications:  certain migraine medicines like ergotamine and dihydroergotamine (DHE)  medicines used to treat erectile dysfunction like sildenafil, tadalafil, and vardenafil  riociguat This medicine may also interact with the following medications:  alteplase  aspirin  heparin  medicines for high blood pressure  medicines for mental depression  other medicines used to treat angina  phenothiazines like chlorpromazine, mesoridazine, prochlorperazine, thioridazine This list may not describe all possible interactions. Give your health care provider a list of all the medicines, herbs, non-prescription drugs, or dietary supplements you use. Also tell them if you smoke, drink alcohol, or use illegal drugs. Some items may interact with your medicine. What should I watch for while using this medicine? Tell your doctor or health care professional if you feel your medicine is no longer working. Keep this medicine with you at all times. Sit or lie down when you take your medicine to prevent falling if you feel dizzy or faint after using it. Try to remain calm. This will help you to feel better faster. If you feel dizzy, take several deep breaths and lie down with your feet propped up, or bend forward with your head resting between your knees. You may get drowsy or dizzy. Do not drive, use machinery, or do anything that needs mental alertness until you know how this drug affects you. Do not stand or sit up quickly, especially if you are an older patient. This reduces the risk of dizzy or fainting spells. Alcohol can make you more drowsy and dizzy. Avoid alcoholic  drinks. Do not treat yourself for coughs, colds, or pain while you are taking this medicine without asking your doctor or health care professional for advice. Some ingredients may increase your blood pressure. What side effects may I notice from receiving this medicine? Side effects that you should report to your doctor or health care professional as soon as possible:  blurred vision  dry mouth  skin rash  sweating  the feeling of extreme pressure in the head  unusually weak or tired Side effects that usually do not require medical attention (report to your doctor or health care professional if they continue or are bothersome):  flushing of the face or neck  headache  irregular heartbeat, palpitations  nausea, vomiting This list may not describe all possible side effects. Call your doctor for medical advice about side effects. You may report side effects to FDA at 1-800-FDA-1088. Where should I keep my medicine? Keep out of the reach of children. Store at room temperature between 20 and 25 degrees C (68 and 77 degrees F). Store in Retail buyer. Protect from light and moisture. Keep tightly closed. Throw away any unused medicine after the expiration date. NOTE: This sheet is a summary. It may not cover all possible information. If you have questions about this medicine, talk to your doctor, pharmacist, or health care provider.  2020 Elsevier/Gold Standard (2013-10-01 17:57:36)  Furosemide Oral Tablets What is this medicine? FUROSEMIDE (fyoor [place] se mide) is a diuretic. It helps you make more urine and to lose salt and excess water from your body. It treats swelling from heart, kidney, or liver disease. It also treats high blood pressure. This medicine may be used for other purposes; ask your health care provider or pharmacist if you have questions. COMMON BRAND NAME(S): Active-Medicated Specimen Kit, Delone, Diuscreen, Lasix,  RX Specimen Collection Kit, Specimen Collection  Kit, URINX Medicated Specimen Collection What should I tell my health care provider before I take this medicine? They need to know if you have any of these conditions:  abnormal blood electrolytes  diarrhea or vomiting  gout  heart disease  kidney disease, small amounts of urine, or difficulty passing urine  liver disease  thyroid disease  an unusual or allergic reaction to furosemide, sulfa drugs, other medicines, foods, dyes, or preservatives  pregnant or trying to get pregnant  breast-feeding How should I use this medicine? Take this drug by mouth. Take it as directed on the prescription label at the same time every day. You can take it with or without food. If it upsets your stomach, take it with food. Keep taking it unless your health care provider tells you to stop. Talk to your health care provider about the use of this drug in children. Special care may be needed. Overdosage: If you think you have taken too much of this medicine contact a poison control center or emergency room at once. NOTE: This medicine is only for you. Do not share this medicine with others. What if I miss a dose? If you miss a dose, take it as soon as you can. If it is almost time for your next dose, take only that dose. Do not take double or extra doses. What may interact with this medicine?  aspirin and aspirin-like medicines  certain antibiotics  chloral hydrate  cisplatin  cyclosporine  digoxin  diuretics  laxatives  lithium  medicines for blood pressure  medicines that relax muscles for surgery  methotrexate  NSAIDs, medicines for pain and inflammation like ibuprofen, naproxen, or indomethacin  phenytoin  steroid medicines like prednisone or cortisone  sucralfate  thyroid hormones This list may not describe all possible interactions. Give your health care provider a list of all the medicines, herbs, non-prescription drugs, or dietary supplements you use. Also tell  them if you smoke, drink alcohol, or use illegal drugs. Some items may interact with your medicine. What should I watch for while using this medicine? Visit your doctor or health care provider for regular checks on your progress. Check your blood pressure regularly. Ask your doctor or health care provider what your blood pressure should be, and when you should contact him or her. If you are a diabetic, check your blood sugar as directed. This medicine may cause serious skin reactions. They can happen weeks to months after starting the medicine. Contact your health care provider right away if you notice fevers or flu-like symptoms with a rash. The rash may be red or purple and then turn into blisters or peeling of the skin. Or, you might notice a red rash with swelling of the face, lips or lymph nodes in your neck or under your arms. You may need to be on a special diet while taking this medicine. Check with your doctor. Also, ask how many glasses of fluid you need to drink a day. You must not get dehydrated. You may get drowsy or dizzy. Do not drive, use machinery, or do anything that needs mental alertness until you know how this drug affects you. Do not stand or sit up quickly, especially if you are an older patient. This reduces the risk of dizzy or fainting spells. Alcohol can make you more drowsy and dizzy. Avoid alcoholic drinks. This medicine can make you more sensitive to the sun. Keep out of the sun. If you cannot  avoid being in the sun, wear protective clothing and use sunscreen. Do not use sun lamps or tanning beds/booths. What side effects may I notice from receiving this medicine? Side effects that you should report to your doctor or health care professional as soon as possible:  blood in urine or stools  dry mouth  fever or chills  hearing loss or ringing in the ears  irregular heartbeat  muscle pain or weakness, cramps  rash, fever, and swollen lymph nodes  redness, blistering,  peeling or loosening of the skin, including inside the mouth  skin rash  stomach upset, pain, or nausea  tingling or numbness in the hands or feet  unusually weak or tired  vomiting or diarrhea  yellowing of the eyes or skin Side effects that usually do not require medical attention (report to your doctor or health care professional if they continue or are bothersome):  headache  loss of appetite  unusual bleeding or bruising This list may not describe all possible side effects. Call your doctor for medical advice about side effects. You may report side effects to FDA at 1-800-FDA-1088. Where should I keep my medicine? Keep out of the reach of children and pets. Store at room temperature between 20 and 25 degrees C (68 and 77 degrees F). Protect from light and moisture. Keep the container tightly closed. Throw away any unused drug after the expiration date. NOTE: This sheet is a summary. It may not cover all possible information. If you have questions about this medicine, talk to your doctor, pharmacist, or health care provider.  2020 Elsevier/Gold Standard (2019-07-21 18:01:32)

## 2020-09-28 LAB — CBC
Hematocrit: 36.3 % (ref 34.0–46.6)
Hemoglobin: 11.4 g/dL (ref 11.1–15.9)
MCH: 29.2 pg (ref 26.6–33.0)
MCHC: 31.4 g/dL — ABNORMAL LOW (ref 31.5–35.7)
MCV: 93 fL (ref 79–97)
Platelets: 273 10*3/uL (ref 150–450)
RBC: 3.91 x10E6/uL (ref 3.77–5.28)
RDW: 13.3 % (ref 11.7–15.4)
WBC: 8.6 10*3/uL (ref 3.4–10.8)

## 2020-09-28 LAB — BASIC METABOLIC PANEL
BUN/Creatinine Ratio: 17 (ref 12–28)
BUN: 24 mg/dL (ref 8–27)
CO2: 23 mmol/L (ref 20–29)
Calcium: 9.6 mg/dL (ref 8.7–10.3)
Chloride: 109 mmol/L — ABNORMAL HIGH (ref 96–106)
Creatinine, Ser: 1.38 mg/dL — ABNORMAL HIGH (ref 0.57–1.00)
GFR calc Af Amer: 48 mL/min/{1.73_m2} — ABNORMAL LOW (ref >59)
GFR calc non Af Amer: 42 mL/min/{1.73_m2} — ABNORMAL LOW (ref >59)
Glucose: 102 mg/dL — ABNORMAL HIGH (ref 65–99)
Potassium: 4.6 mmol/L (ref 3.5–5.2)
Sodium: 145 mmol/L — ABNORMAL HIGH (ref 134–144)

## 2020-09-28 LAB — PRO B NATRIURETIC PEPTIDE: NT-Pro BNP: 736 pg/mL — ABNORMAL HIGH (ref 0–287)

## 2020-09-28 LAB — MAGNESIUM: Magnesium: 1.8 mg/dL (ref 1.6–2.3)

## 2020-09-28 LAB — TSH: TSH: 3.77 u[IU]/mL (ref 0.450–4.500)

## 2020-09-29 ENCOUNTER — Telehealth: Payer: Self-pay

## 2020-09-29 NOTE — Telephone Encounter (Signed)
-----   Message from Thomasene Ripple, DO sent at 09/28/2020  6:01 PM EDT ----- proBNP is elevated, which is indicating you may have a lot more fluids on you.  Her kidney function is also slightly elevated same medications for now

## 2020-09-29 NOTE — Telephone Encounter (Signed)
Left message on patients voicemail to please return our call.   

## 2020-10-06 ENCOUNTER — Telehealth (HOSPITAL_COMMUNITY): Payer: Self-pay | Admitting: Emergency Medicine

## 2020-10-06 NOTE — Telephone Encounter (Signed)
Reaching out to patient to offer assistance regarding upcoming cardiac imaging study; pt verbalizes understanding of appt date/time, parking situation and where to check in, pre-test NPO status and medications ordered, and verified current allergies; name and call back number provided for further questions should they arise Rockwell Alexandria RN Navigator Cardiac Imaging [facility] [phone] office [phone] cell  Informed patient of IV hydration protocol for her CCTA appt on Monday. Her GFR was < 45 which requires the IV hydration per the [facility] radiology protocols.  Pt instructed to check in at admitting at 745a for her 8am Infusion clinic appt.  Pt verbalized understanding and appreciated the call. Huntley Dec

## 2020-10-10 ENCOUNTER — Ambulatory Visit (HOSPITAL_COMMUNITY)
Admission: RE | Admit: 2020-10-10 | Discharge: 2020-10-10 | Disposition: A | Discharge status: Home / Self Care | Payer: Commercial Managed Care - PPO | Source: Ambulatory Visit | Attending: Cardiology | Admitting: Cardiology

## 2020-10-10 ENCOUNTER — Other Ambulatory Visit: Payer: Self-pay

## 2020-10-10 DIAGNOSIS — R42 Dizziness and giddiness: Secondary | ICD-10-CM | POA: Diagnosis present

## 2020-10-10 DIAGNOSIS — R079 Chest pain, unspecified: Secondary | ICD-10-CM

## 2020-10-10 DIAGNOSIS — R600 Localized edema: Secondary | ICD-10-CM | POA: Insufficient documentation

## 2020-10-10 DIAGNOSIS — I10 Essential (primary) hypertension: Secondary | ICD-10-CM | POA: Insufficient documentation

## 2020-10-10 LAB — BASIC METABOLIC PANEL
Anion gap: 9 (ref 5–15)
BUN: 33 mg/dL — ABNORMAL HIGH (ref 6–20)
CO2: 28 mmol/L (ref 22–32)
Calcium: 10 mg/dL (ref 8.9–10.3)
Chloride: 105 mmol/L (ref 98–111)
Creatinine, Ser: 1.46 mg/dL — ABNORMAL HIGH (ref 0.44–1.00)
GFR, Estimated: 41 mL/min — ABNORMAL LOW (ref >60)
Glucose, Bld: 122 mg/dL — ABNORMAL HIGH (ref 70–99)
Potassium: 4.4 mmol/L (ref 3.5–5.1)
Sodium: 142 mmol/L (ref 135–145)

## 2020-10-10 MED ORDER — SODIUM CHLORIDE 0.9 % WEIGHT BASED INFUSION: 1.0000 mL/kg/h | INTRAVENOUS | Status: DC

## 2020-10-10 MED ORDER — NITROGLYCERIN 0.4 MG SL SUBL
SUBLINGUAL_TABLET | SUBLINGUAL | Status: AC
  Filled 2020-10-10: qty 2

## 2020-10-10 MED ORDER — SODIUM CHLORIDE 0.9 % WEIGHT BASED INFUSION
3.0000 mL/kg/h | INTRAVENOUS | Status: AC
  Administered 2020-10-10: 3 mL/kg/h via INTRAVENOUS

## 2020-10-10 NOTE — Progress Notes (Signed)
Patient here for CT coronary. Patient was brought from the infusion clinic straight to CT-1. Once hooked up to monitor patient was frequent multifocal PVCs and PACs with rates varying from 40s to 110s. BP obtained 184/77. Spoke with Dr. Scharlene Gloss of irregularities and he stated to cancel the test and reach out to Dr. Servando Salina for next steps. Notified Huntley Dec- CT navigator of event. Patient back to infusion clinic and discharged from there.

## 2020-10-13 ENCOUNTER — Telehealth: Payer: Self-pay | Admitting: Physician Assistant

## 2020-10-13 NOTE — Telephone Encounter (Signed)
Paged by Meredeth Ide.  Complete heart block on 09/29/20 for 14.7sec Ave HR 30 11:34pm.  Mobitz II 10/15 @ 12:56am 8.1 sec. HR 29.   Result uploaded per iRhythm.   Called patient but no one picked up phone. Please reach out to the patient in morning.

## 2020-10-14 ENCOUNTER — Emergency Department (HOSPITAL_COMMUNITY): Payer: Commercial Managed Care - PPO

## 2020-10-14 ENCOUNTER — Ambulatory Visit (HOSPITAL_COMMUNITY)
Admission: EM | Disposition: A | Discharge status: Home / Self Care | Payer: Self-pay | Source: Home / Self Care | Attending: Emergency Medicine

## 2020-10-14 ENCOUNTER — Observation Stay (HOSPITAL_COMMUNITY)
Admission: EM | Admit: 2020-10-14 | Discharge: 2020-10-15 | Disposition: A | Discharge status: Home / Self Care | Payer: Commercial Managed Care - PPO | Attending: Cardiology | Admitting: Cardiology

## 2020-10-14 ENCOUNTER — Other Ambulatory Visit: Payer: Self-pay

## 2020-10-14 ENCOUNTER — Encounter (HOSPITAL_COMMUNITY): Payer: Self-pay | Admitting: Emergency Medicine

## 2020-10-14 DIAGNOSIS — I441 Atrioventricular block, second degree: Principal | ICD-10-CM | POA: Diagnosis present

## 2020-10-14 DIAGNOSIS — R001 Bradycardia, unspecified: Secondary | ICD-10-CM | POA: Diagnosis present

## 2020-10-14 DIAGNOSIS — Z20822 Contact with and (suspected) exposure to covid-19: Secondary | ICD-10-CM | POA: Diagnosis not present

## 2020-10-14 DIAGNOSIS — I459 Conduction disorder, unspecified: Secondary | ICD-10-CM

## 2020-10-14 DIAGNOSIS — I10 Essential (primary) hypertension: Secondary | ICD-10-CM | POA: Diagnosis not present

## 2020-10-14 DIAGNOSIS — R42 Dizziness and giddiness: Secondary | ICD-10-CM | POA: Diagnosis present

## 2020-10-14 DIAGNOSIS — Z950 Presence of cardiac pacemaker: Secondary | ICD-10-CM | POA: Diagnosis not present

## 2020-10-14 DIAGNOSIS — Z79899 Other long term (current) drug therapy: Secondary | ICD-10-CM | POA: Diagnosis not present

## 2020-10-14 DIAGNOSIS — Z95818 Presence of other cardiac implants and grafts: Secondary | ICD-10-CM

## 2020-10-14 DIAGNOSIS — I442 Atrioventricular block, complete: Secondary | ICD-10-CM | POA: Diagnosis present

## 2020-10-14 HISTORY — PX: PACEMAKER IMPLANT: EP1218

## 2020-10-14 HISTORY — DX: Disorder of thyroid, unspecified: E07.9

## 2020-10-14 HISTORY — DX: Atrioventricular block, second degree: I44.1

## 2020-10-14 LAB — BASIC METABOLIC PANEL
Anion gap: 11 (ref 5–15)
BUN: 45 mg/dL — ABNORMAL HIGH (ref 6–20)
CO2: 24 mmol/L (ref 22–32)
Calcium: 10.2 mg/dL (ref 8.9–10.3)
Chloride: 108 mmol/L (ref 98–111)
Creatinine, Ser: 1.74 mg/dL — ABNORMAL HIGH (ref 0.44–1.00)
GFR, Estimated: 33 mL/min — ABNORMAL LOW (ref >60)
Glucose, Bld: 104 mg/dL — ABNORMAL HIGH (ref 70–99)
Potassium: 4.5 mmol/L (ref 3.5–5.1)
Sodium: 143 mmol/L (ref 135–145)

## 2020-10-14 LAB — RESPIRATORY PANEL BY RT PCR (FLU A&B, COVID)
Influenza A by PCR: NEGATIVE
Influenza B by PCR: NEGATIVE
SARS Coronavirus 2 by RT PCR: NEGATIVE

## 2020-10-14 LAB — CBC
HCT: 42.1 % (ref 36.0–46.0)
Hemoglobin: 12.9 g/dL (ref 12.0–15.0)
MCH: 29.8 pg (ref 26.0–34.0)
MCHC: 30.6 g/dL (ref 30.0–36.0)
MCV: 97.2 fL (ref 80.0–100.0)
Platelets: 313 10*3/uL (ref 150–400)
RBC: 4.33 MIL/uL (ref 3.87–5.11)
RDW: 13.2 % (ref 11.5–15.5)
WBC: 8.5 10*3/uL (ref 4.0–10.5)
nRBC: 0 % (ref 0.0–0.2)

## 2020-10-14 LAB — MAGNESIUM: Magnesium: 1.9 mg/dL (ref 1.7–2.4)

## 2020-10-14 LAB — TSH: TSH: 2.587 u[IU]/mL (ref 0.350–4.500)

## 2020-10-14 LAB — TROPONIN I (HIGH SENSITIVITY)
Troponin I (High Sensitivity): 16 ng/L (ref <18)
Troponin I (High Sensitivity): 19 ng/L — ABNORMAL HIGH (ref <18)

## 2020-10-14 LAB — I-STAT BETA HCG BLOOD, ED (MC, WL, AP ONLY): I-stat hCG, quantitative: <5 m[IU]/mL (ref <5)

## 2020-10-14 SURGERY — PACEMAKER IMPLANT

## 2020-10-14 MED ORDER — CEFAZOLIN SODIUM-DEXTROSE 2-4 GM/100ML-% IV SOLN
2.0000 g | INTRAVENOUS | Status: AC
  Administered 2020-10-14: 2 g via INTRAVENOUS

## 2020-10-14 MED ORDER — LIDOCAINE HCL 1 % IJ SOLN
INTRAMUSCULAR | Status: AC
  Filled 2020-10-14: qty 60

## 2020-10-14 MED ORDER — LEVOTHYROXINE SODIUM 50 MCG PO TABS
50.0000 ug | ORAL_TABLET | Freq: Every day | ORAL | Status: DC
  Administered 2020-10-15: 50 ug via ORAL
  Filled 2020-10-14: qty 1

## 2020-10-14 MED ORDER — ACETAMINOPHEN 325 MG PO TABS
325.0000 mg | ORAL_TABLET | ORAL | Status: DC | PRN
  Administered 2020-10-15 (×2): 650 mg via ORAL
  Filled 2020-10-14 (×2): qty 2

## 2020-10-14 MED ORDER — NAPROXEN 250 MG PO TABS
500.0000 mg | ORAL_TABLET | Freq: Two times a day (BID) | ORAL | Status: DC
  Administered 2020-10-15: 500 mg via ORAL
  Filled 2020-10-14 (×2): qty 2

## 2020-10-14 MED ORDER — CEFAZOLIN SODIUM-DEXTROSE 2-4 GM/100ML-% IV SOLN
INTRAVENOUS | Status: AC
  Filled 2020-10-14: qty 100

## 2020-10-14 MED ORDER — HYDRALAZINE HCL 20 MG/ML IJ SOLN
10.0000 mg | Freq: Once | INTRAMUSCULAR | Status: AC
  Administered 2020-10-14: 10 mg via INTRAVENOUS

## 2020-10-14 MED ORDER — PANTOPRAZOLE SODIUM 40 MG PO TBEC
40.0000 mg | DELAYED_RELEASE_TABLET | Freq: Every day | ORAL | Status: DC
  Administered 2020-10-15: 40 mg via ORAL
  Filled 2020-10-14: qty 1

## 2020-10-14 MED ORDER — SODIUM CHLORIDE 0.9 % IV SOLN: 250.0000 mL | INTRAVENOUS | Status: DC

## 2020-10-14 MED ORDER — POTASSIUM CHLORIDE CRYS ER 20 MEQ PO TBCR: 20.0000 meq | EXTENDED_RELEASE_TABLET | ORAL | Status: DC

## 2020-10-14 MED ORDER — MIDAZOLAM HCL 5 MG/5ML IJ SOLN
INTRAMUSCULAR | Status: DC | PRN
  Administered 2020-10-14: 1 mg via INTRAVENOUS

## 2020-10-14 MED ORDER — CHLORHEXIDINE GLUCONATE 4 % EX LIQD
60.0000 mL | Freq: Once | CUTANEOUS | Status: DC
  Filled 2020-10-14: qty 60

## 2020-10-14 MED ORDER — ROSUVASTATIN CALCIUM 5 MG PO TABS
10.0000 mg | ORAL_TABLET | Freq: Every day | ORAL | Status: DC
  Administered 2020-10-15: 10 mg via ORAL
  Filled 2020-10-14: qty 2

## 2020-10-14 MED ORDER — FUROSEMIDE 20 MG PO TABS: 20.0000 mg | ORAL_TABLET | ORAL | Status: DC

## 2020-10-14 MED ORDER — MIDAZOLAM HCL 5 MG/5ML IJ SOLN
INTRAMUSCULAR | Status: AC
  Filled 2020-10-14: qty 5

## 2020-10-14 MED ORDER — HEPARIN (PORCINE) IN NACL 1000-0.9 UT/500ML-% IV SOLN
INTRAVENOUS | Status: DC | PRN
  Administered 2020-10-14: 500 mL

## 2020-10-14 MED ORDER — SODIUM CHLORIDE 0.9 % IV SOLN
INTRAVENOUS | Status: AC
  Filled 2020-10-14: qty 2

## 2020-10-14 MED ORDER — LIDOCAINE HCL 1 % IJ SOLN
INTRAMUSCULAR | Status: AC
  Filled 2020-10-14: qty 20

## 2020-10-14 MED ORDER — HYDRALAZINE HCL 20 MG/ML IJ SOLN
INTRAMUSCULAR | Status: AC
  Filled 2020-10-14: qty 1

## 2020-10-14 MED ORDER — ONDANSETRON HCL 4 MG/2ML IJ SOLN
4.0000 mg | Freq: Four times a day (QID) | INTRAMUSCULAR | Status: DC | PRN
  Administered 2020-10-15 (×2): 4 mg via INTRAVENOUS
  Filled 2020-10-14 (×2): qty 2

## 2020-10-14 MED ORDER — FENTANYL CITRATE (PF) 100 MCG/2ML IJ SOLN
INTRAMUSCULAR | Status: AC
  Filled 2020-10-14: qty 2

## 2020-10-14 MED ORDER — HEPARIN (PORCINE) IN NACL 1000-0.9 UT/500ML-% IV SOLN
INTRAVENOUS | Status: AC
  Filled 2020-10-14: qty 500

## 2020-10-14 MED ORDER — SODIUM CHLORIDE 0.9% FLUSH: 3.0000 mL | INTRAVENOUS | Status: DC | PRN

## 2020-10-14 MED ORDER — VITAMIN D (ERGOCALCIFEROL) 1.25 MG (50000 UNIT) PO CAPS: 50000.0000 [IU] | ORAL_CAPSULE | ORAL | Status: DC

## 2020-10-14 MED ORDER — LIDOCAINE HCL (PF) 1 % IJ SOLN
INTRAMUSCULAR | Status: DC | PRN
  Administered 2020-10-14: 20 mL
  Administered 2020-10-14: 60 mL

## 2020-10-14 MED ORDER — NITROGLYCERIN 0.4 MG SL SUBL: 0.4000 mg | SUBLINGUAL_TABLET | SUBLINGUAL | Status: DC | PRN

## 2020-10-14 MED ORDER — CEFAZOLIN SODIUM-DEXTROSE 1-4 GM/50ML-% IV SOLN
1.0000 g | Freq: Three times a day (TID) | INTRAVENOUS | Status: DC
  Administered 2020-10-15 (×2): 1 g via INTRAVENOUS
  Filled 2020-10-14 (×3): qty 50

## 2020-10-14 MED ORDER — FENTANYL CITRATE (PF) 100 MCG/2ML IJ SOLN
INTRAMUSCULAR | Status: DC | PRN
  Administered 2020-10-14: 25 ug via INTRAVENOUS

## 2020-10-14 MED ORDER — SODIUM CHLORIDE 0.9 % IV SOLN
80.0000 mg | INTRAVENOUS | Status: AC
  Administered 2020-10-14: 80 mg
  Filled 2020-10-14: qty 2

## 2020-10-14 MED ORDER — SODIUM CHLORIDE 0.9% FLUSH
3.0000 mL | Freq: Two times a day (BID) | INTRAVENOUS | Status: DC
  Administered 2020-10-15 (×2): 3 mL via INTRAVENOUS

## 2020-10-14 MED ORDER — SODIUM CHLORIDE 0.9 % IV SOLN: INTRAVENOUS | Status: DC

## 2020-10-14 SURGICAL SUPPLY — 9 items
CABLE SURGICAL S-101-97-12 (CABLE) ×3 IMPLANT
IPG PACE AZUR XT DR MRI W1DR01 (Pacemaker) ×1 IMPLANT
LEAD CAPSURE NOVUS 45CM (Lead) ×3 IMPLANT
LEAD CAPSURE NOVUS 5076-52CM (Lead) ×3 IMPLANT
PACE AZURE XT DR MRI W1DR01 (Pacemaker) ×3 IMPLANT
PAD PRO RADIOLUCENT 2001M-C (PAD) ×3 IMPLANT
SHEATH 7FR PRELUDE SNAP 13 (SHEATH) ×6 IMPLANT
SHEATH PROBE COVER 6X72 (BAG) ×3 IMPLANT
TRAY PACEMAKER INSERTION (PACKS) ×3 IMPLANT

## 2020-10-14 NOTE — Discharge Instructions (Signed)
    Supplemental Discharge Instructions for  Pacemaker/Defibrillator Patients   Activity No heavy lifting or vigorous activity with your left/right arm for 6 to 8 weeks.  Do not raise your left/right arm above your head for one week.  Gradually raise your affected arm as drawn below.             10/18/2020                 10/19/2020                10/20/2020              10/21/2020   NO DRIVING for  1 week   ; you may begin driving on  42/02/5360  .  WOUND CARE - Keep the wound area clean and dry.  Do not get this area wet , no showers until cleared to at your wound check visit . - The tape/steri-strips on your wound will fall off; do not pull them off.  No bandage is needed on the site.  DO  NOT apply any creams, oils, or ointments to the wound area. - If you notice any drainage or discharge from the wound, any swelling or bruising at the site, or you develop a fever > 101? F after you are discharged home, call the office at once.  Special Instructions - You are still able to use cellular telephones; use the ear opposite the side where you have your pacemaker/defibrillator.  Avoid carrying your cellular phone near your device. - When traveling through airports, show security personnel your identification card to avoid being screened in the metal detectors.  Ask the security personnel to use the hand wand. - Avoid arc welding equipment, MRI testing (magnetic resonance imaging), TENS units (transcutaneous nerve stimulators).  Call the office for questions about other devices. - Avoid electrical appliances that are in poor condition or are not properly grounded. - Microwave ovens are safe to be near or to operate.

## 2020-10-14 NOTE — ED Notes (Signed)
Patient transported to x-ray. ?

## 2020-10-14 NOTE — Interval H&P Note (Signed)
History and Physical Interval Note:  Cynthia Archer has presented today for surgery, with the diagnosis of second degree AV block.  The various methods of treatment have been discussed with the patient and family. After consideration of risks, benefits and other options for treatment, the patient has consented to  Procedure(s): Pacemaker implant as a surgical intervention .  Risks include but not limited to bleeding, infection, pneumothorax, pericardial effusion, lead dislodgement, heart attack, stroke, or death. The patient's history has been reviewed, patient examined, no change in status, stable for surgery.  I have reviewed the patient's chart and labs.  Questions were answered to the patient's satisfaction.    Cynthia Peeters Elberta Fortis, MD 10/14/2020 11:24 AM

## 2020-10-14 NOTE — H&P (Addendum)
H&P:   Patient ID: Cynthia Archer MRN: [id number]; DOB: 1961/02/13  Admit date: 10/14/2020 Date of Consult: 10/14/2020  Primary Care Provider: Dema Severin, NP [facility] Cardiologist: Thomasene Ripple, DO  [facility] Electrophysiologist:  None    Patient Profile:   Cynthia Archer is a 59 y.o. female with a hx of HTN, hypothyroidism, obesity, Mobitz I who is being seen today for the evaluation of advanced heart block at the request of Dr. Tobb/Dr. Julieanne Manson.  History of Present Illness:   Ms. Pecha was recently seen by Dr. Servando Salina 09/27/2020, in review of her note seems referred to [facility] for dobutamine stress test she was noted to be in Mobitz I rhythm and seems stress testing deferred  The patient reports they did an echo and EKG.  Dr. Servando Salina mentions her echo had normal LVEF and no significant VHD.  Planned for CTa coronaries and monitoring.  The patient reports that for a few months she has not felt well, feeling like she can do much of anything without getting winded and generally weak.  She Abed Schar as well feel like she is getting lightheaded/dizzy.  She has not had syncope.   LABS So far WBC 8.5 H/H 12/42 Plts 313 K+ 4.5 BUN/Creat 45/1.74  HS Trop 16 preg I stat <5.0 (negative)  COVID is done, pendingresult  09/27/20 TSH 3.770 Mag 1.8  10/10/20 K+ 4.4 BUN/Creat 33/1.46   Past Medical History:  Diagnosis Date   Essential hypertension 02/26/2017   Hypercalcemia 02/26/2017   Hyperparathyroidism (HCC) 02/26/2017   Thyroid disease    Vitamin D deficiency 02/26/2017    Past Surgical History:  Procedure Laterality Date   ANKLE SURGERY     THYROID SURGERY     TUBAL LIGATION       Home Medications:  Prior to Admission medications   Medication Sig Start Date End Date Taking? Authorizing Provider  buPROPion [facility] SR) 150 MG 12 hr tablet  07/18/20   [provider]  furosemide (LASIX) 20 MG tablet Take one tablet on tuesdays, thursdays, and  saturdays. 09/27/20   Tobb, Kardie, DO  levothyroxine (SYNTHROID) 50 MCG tablet Take 50 mcg by mouth daily. 09/20/20   [provider]  nitroGLYCERIN (NITROSTAT) 0.4 MG SL tablet Place 1 tablet (0.4 mg total) under the tongue every 5 (five) minutes as needed for chest pain. 09/27/20 12/26/20  Tobb, Kardie, DO  olmesartan (BENICAR) 20 MG tablet Take 20 mg by mouth daily. 09/20/20   [provider]  omeprazole (PRILOSEC) 40 MG capsule Take 40 mg by mouth daily. 09/25/20   [provider]  potassium chloride SA (KLOR-CON) 20 MEQ tablet Take one tablet on Tuesday, Thursday, and Saturday. 09/27/20   Thomasene Ripple, DO    Inpatient Medications: Scheduled Meds:  Continuous Infusions:  PRN Meds:   Allergies:   No Known Allergies  Social History:   Social History   Socioeconomic History   Marital status: Married    Spouse name: Not on file   Number of children: Not on file   Years of education: Not on file   Highest education level: Not on file  Occupational History   Not on file  Tobacco Use   Smoking status: Never Smoker   Smokeless tobacco: Never Used  Substance and Sexual Activity   Alcohol use: Not Currently   Drug use: Never   Sexual activity: Not on file  Other Topics Concern   Not on file  Social History Narrative   Not  on file   Social Determinants of Health   Financial Resource Strain:    Difficulty of Paying Living Expenses: Not on file  Food Insecurity:    Worried About Running Out of Food in the Last Year: Not on file   Ran Out of Food in the Last Year: Not on file  Transportation Needs:    Lack of Transportation (Medical): Not on file   Lack of Transportation (Non-Medical): Not on file  Physical Activity:    Days of Exercise per Week: Not on file   Minutes of Exercise per Session: Not on file  Stress:    Feeling of Stress : Not on file  Social Connections:    Frequency of Communication with Friends and Family: Not on file    Frequency of Social Gatherings with Friends and Family: Not on file   Attends Religious Services: Not on file   Active Member of Clubs or Organizations: Not on file   Attends Banker Meetings: Not on file   Marital Status: Not on file  Intimate Partner Violence:    Fear of Current or Ex-Partner: Not on file   Emotionally Abused: Not on file   Physically Abused: Not on file   Sexually Abused: Not on file    Family History:   Family History  Problem Relation Age of Onset   Heart disease Mother    Kidney failure Mother    COPD Father    Lung cancer Father    Alzheimer's disease Maternal Grandmother    Lung cancer Maternal Grandfather    Lung cancer Paternal Grandfather      ROS:  Please see the history of present illness.  All other ROS reviewed and negative.     Physical Exam/Data:   Vitals:   10/14/20 0926 10/14/20 0945  BP: (!) 156/111 (!) 163/66  Pulse: (!) 38 (!) 40  Resp: 17 15  Temp: 97.6 F (36.4 C)   TempSrc: Oral   SpO2: 99% 97%   No intake or output data in the 24 hours ending 10/14/20 1012 Last 3 Weights 09/27/2020  Weight (lbs) 234 lb  Weight (kg) 106.142 kg     There is no height or weight on file to calculate BMI.  General:  Well nourished, well developed, in no acute physical distress, though emotional HEENT: normal Lymph: no adenopathy Neck: no JVD Endocrine:  No thryomegaly Vascular: No carotid bruits  Cardiac: RRR; bradycardic, no murmurs, gallops or rubs Lungs:  CTA b/l, no wheezing, rhonchi or rales  Abd: soft, nontender  Ext: no edema Musculoskeletal:  No deformities Skin: warm and dry  Neuro:  no focal abnormalities noted Psych:  Normal affect   EKG:  The EKG was personally reviewed and demonstrates:   2:1 AVblock Telemetry:  Telemetry was personally reviewed and demonstrates:   2:1 AVblock, intermittently conducts 1:1  Relevant CV Studies:  Zio XT> 10/12-10/19/21 SR, brief PATs, V bigemeny with what looks Junctional  underlying , Mobitz I and 2:1, CHB with V rates high 20's (nocturnal),   Laboratory Data:  High Sensitivity Troponin:  No results for input(s): TROPONINIHS in the last 720 hours.   Chemistry Recent Labs  Lab 10/10/20 0812  NA 142  K 4.4  CL 105  CO2 28  GLUCOSE 122*  BUN 33*  CREATININE 1.46*  CALCIUM 10.0  GFRNONAA 41*  ANIONGAP 9    No results for input(s): PROT, ALBUMIN, AST, ALT, ALKPHOS, BILITOT in the last 168 hours. HematologyNo results for input(s):  WBC, RBC, HGB, HCT, MCV, MCH, MCHC, RDW, PLT in the last 168 hours. BNPNo results for input(s): BNP, PROBNP in the last 168 hours.  DDimer No results for input(s): DDIMER in the last 168 hours.   Radiology/Studies:  No results found.   Assessment and Plan:   1. Symptomatic bradycardia 2. Conduction system disease, including CHB     Worsening exertional capacity     No reviersible causes, not on any noted nodal blocking agents, TSH is wnl  HS Trop neg (x1)  Dr. Elberta Fortis has seen and examined the patient  recommends PPM, discussed rational for pacing Discussed implant procedure, potential risks and benefits with the patient and her son at bedside. She is agreeable to proceed.  BP stable Rates 40's-50's  Darrie Macmillan plan for today   3. AKI     Likely 2/2 bradycardia     Follow tomorrow if better as expect it Chaquana Nichols be Makinzey Banes resume her ARB  4. HTN     Resume home meds post implant    For questions or updates, please contact [facility] Please consult [email or web address] for contact info under    Signed, Sheilah Pigeon, PA-C  10/14/2020 10:12 AM  I have seen and examined this patient with Francis Dowse.  Agree with above, note added to reflect my findings.  On exam, bradycardic, no murmurs. Wore montior with 2:1 AV block. Has SOB and fatigue. Jessyca Sloan plan pacemaker imlant.    Analy Bassford M. Deovion Batrez MD 10/14/2020 11:21 AM

## 2020-10-14 NOTE — ED Notes (Signed)
Pt refusing to be covid swabbed, states she does not want it stuck far up her nose."I was at the doctors last week and they only went into my nostril" Explained to patient proper way to swab and the reasoning. PA to bedside with nurse and explained again to patient reason and correct procedure. Pt agreed, but remains agitated with staff.

## 2020-10-14 NOTE — ED Provider Notes (Addendum)
[facility] EMERGENCY DEPARTMENT Provider Note   CSN: [id number] Arrival date & time: 10/14/20  9628     History Chief Complaint  Patient presents with  . Irregular Heart Beat    Cynthia Archer is a 59 y.o. female.  HPI 59 year old female with a history of essential hypertension, hypercalcemia, hyperparathyroidism, thyroid disease presents to the ER with complaints of 1 month of intermittent dizziness, weakness, shortness of breath. She states normally she is hypertensive but her BP has been normal/low side which she  Currently seeing w/ Dr. Servando Salina w/ cardiology, had an echo which was normal per chart reivew and 7 day holter monitor which showed complete heart block per chart review.  She states that she was called today and told to come to the ER because of how "that her heart was".  She denies any chest pain, does endorse some shortness of breath on exertion which has been ongoing for a month.    Past Medical History:  Diagnosis Date  . Essential hypertension 02/26/2017  . Hypercalcemia 02/26/2017  . Hyperparathyroidism (HCC) 02/26/2017  . Thyroid disease   . Vitamin D deficiency 02/26/2017    Patient Active Problem List   Diagnosis Date Noted  . Essential hypertension 02/26/2017  . Hypercalcemia 02/26/2017  . Hyperparathyroidism (HCC) 02/26/2017  . Vitamin D deficiency 02/26/2017    Past Surgical History:  Procedure Laterality Date  . ANKLE SURGERY    . THYROID SURGERY    . TUBAL LIGATION       OB History   No obstetric history on file.     Family History  Problem Relation Age of Onset  . Heart disease Mother   . Kidney failure Mother   . COPD Father   . Lung cancer Father   . Alzheimer's disease Maternal Grandmother   . Lung cancer Maternal Grandfather   . Lung cancer Paternal Grandfather     Social History   Tobacco Use  . Smoking status: Never Smoker  . Smokeless tobacco: Never Used  Substance Use Topics  . Alcohol use: Not  Currently  . Drug use: Never    Home Medications Prior to Admission medications   Medication Sig Start Date End Date Taking? Authorizing Provider  buPROPion [facility] SR) 150 MG 12 hr tablet  07/18/20   [provider]  furosemide (LASIX) 20 MG tablet Take one tablet on tuesdays, thursdays, and saturdays. 09/27/20   Tobb, Kardie, DO  levothyroxine (SYNTHROID) 50 MCG tablet Take 50 mcg by mouth daily. 09/20/20   [provider]  nitroGLYCERIN (NITROSTAT) 0.4 MG SL tablet Place 1 tablet (0.4 mg total) under the tongue every 5 (five) minutes as needed for chest pain. 09/27/20 12/26/20  Tobb, Kardie, DO  olmesartan (BENICAR) 20 MG tablet Take 20 mg by mouth daily. 09/20/20   [provider]  omeprazole (PRILOSEC) 40 MG capsule Take 40 mg by mouth daily. 09/25/20   [provider]  potassium chloride SA (KLOR-CON) 20 MEQ tablet Take one tablet on Tuesday, Thursday, and Saturday. 09/27/20   Tobb, Lavona Mound, DO    Allergies    Patient has no known allergies.  Review of Systems   Review of Systems  Constitutional: Positive for fatigue. Negative for chills and fever.  HENT: Negative for ear pain and sore throat.   Eyes: Negative for pain and visual disturbance.  Respiratory: Positive for shortness of breath. Negative for cough.   Cardiovascular: Positive for chest pain. Negative for palpitations.  Gastrointestinal:  Negative for abdominal pain and vomiting.  Genitourinary: Negative for dysuria and hematuria.  Musculoskeletal: Negative for arthralgias and back pain.  Skin: Negative for color change and rash.  Neurological: Positive for weakness. Negative for seizures and syncope.  All other systems reviewed and are negative.   Physical Exam Updated Vital Signs BP (!) 168/57   Pulse (!) 43   Temp 97.6 F (36.4 C) (Oral)   Resp (!) 25   LMP  (LMP Unknown)   SpO2 97%   Physical Exam Vitals and nursing note reviewed.  Constitutional:      General: She is  not in acute distress.    Appearance: She is well-developed. She is not ill-appearing, toxic-appearing or diaphoretic.  HENT:     Head: Normocephalic and atraumatic.     Mouth/Throat:     Mouth: Mucous membranes are moist.     Pharynx: Oropharynx is clear.  Eyes:     Conjunctiva/sclera: Conjunctivae normal.  Cardiovascular:     Rate and Rhythm: Regular rhythm. Bradycardia present.     Heart sounds: No murmur heard.      Comments: Bradycardia in the 40s Pulmonary:     Effort: Pulmonary effort is normal. No respiratory distress.     Breath sounds: Normal breath sounds.  Abdominal:     General: Abdomen is flat.     Palpations: Abdomen is soft.     Tenderness: There is no abdominal tenderness.  Musculoskeletal:        General: No tenderness or deformity. Normal range of motion.     Cervical back: Neck supple.  Skin:    General: Skin is warm and dry.  Neurological:     General: No focal deficit present.     Mental Status: She is alert and oriented to person, place, and time.     Sensory: No sensory deficit.     Motor: No weakness.  Psychiatric:        Mood and Affect: Mood normal.        Behavior: Behavior normal.      ED Results / Procedures / Treatments   Labs (all labs ordered are listed, but only abnormal results are displayed) Labs Reviewed  BASIC METABOLIC PANEL - Abnormal; Notable for the following components:      Result Value   Glucose, Bld 104 (*)    BUN 45 (*)    Creatinine, Ser 1.74 (*)    GFR, Estimated 33 (*)    All other components within normal limits  RESPIRATORY PANEL BY RT PCR (FLU A&B, COVID)  SURGICAL PCR SCREEN  CBC  MAGNESIUM  I-STAT BETA HCG BLOOD, ED ([facility], [facility], [facility] ONLY)  TROPONIN I (HIGH SENSITIVITY)  TROPONIN I (HIGH SENSITIVITY)    EKG EKG Interpretation  Date/Time:  Friday October 14 2020 09:20:11 EDT Ventricular Rate:  55 PR Interval:  166 QRS Duration: 116 QT Interval:  452 QTC Calculation: 432 R Axis:   -3 Text  Interpretation: Sinus bradycardia with Premature supraventricular complexes Right bundle branch block Abnormal ECG Possible 2nd degree type 2 heart block vs U waves. No STEMI Confirmed by Theda Belfast ([id number]) on 10/14/2020 9:28:22 AM   Radiology DG Chest 2 View  Result Date: 10/14/2020 CLINICAL DATA:  Irregular heartbeat EXAM: CHEST - 2 VIEW COMPARISON:  10/02/2008 FINDINGS: Heart size upper normal. Vascularity normal. Lungs clear without infiltrate or effusion. IMPRESSION: No acute cardiopulmonary abnormality. Electronically Signed   By: Marlan Palau M.D.   On: 10/14/2020 10:20    Procedures .  Critical Care Performed by: Mare Ferrari, PA-C Authorized by: Mare Ferrari, PA-C   Critical care provider statement:    Critical care time (minutes):  45   Critical care was necessary to treat or prevent imminent or life-threatening deterioration of the following conditions:  Cardiac failure   Critical care was time spent personally by me on the following activities:  Discussions with consultants, evaluation of patient's response to treatment, examination of patient, ordering and performing treatments and interventions, ordering and review of laboratory studies, ordering and review of radiographic studies, pulse oximetry, re-evaluation of patient's condition, obtaining history from patient or surrogate and review of old charts   (including critical care time)  Medications Ordered in ED Medications  0.9 %  sodium chloride infusion (has no administration in time range)  gentamicin (GARAMYCIN) 80 mg in sodium chloride 0.9 % 500 mL irrigation (has no administration in time range)  chlorhexidine (HIBICLENS) 4 % liquid 4 application (has no administration in time range)  chlorhexidine (HIBICLENS) 4 % liquid 4 application (has no administration in time range)  sodium chloride flush (NS) 0.9 % injection 3 mL (has no administration in time range)  sodium chloride flush (NS) 0.9 % injection 3 mL  (has no administration in time range)  0.9 %  sodium chloride infusion (has no administration in time range)  ceFAZolin (ANCEF) IVPB 2g/100 mL premix (has no administration in time range)    ED Course  I have reviewed the triage vital signs and the nursing notes.  Pertinent labs & imaging results that were available during my care of the patient were reviewed by me and considered in my medical decision making (see chart for details).    MDM Rules/Calculators/A&P                         59 year old female with weakness, shortness of breath, dizziness x1 month On presentation, she is bradycardic with heart rate ranging from 35-40.  Slightly hypertensive with blood pressure 160/57.  Overall nontoxic appearing, in no acute distress.  Physical exam without any acute abnormalities other than the bradycardia.  Labs reviewed and interpreted by me -CBC without leukocytosis -Normal magnesium -BMP with a creatinine 1.74 and a BUN of 45, which appears to be elevated slightly from baseline.  Normal gap. - Initial troponin normal  -Covid swab pending  Imaging reviewed and interpreted by me -Chest x-ray without acute abnormalities  EKG with what appears with second-degree heart block type 2 as reviewed by Dr. Ellery Plunk cardiology who saw and evaluated the patient.  Patient will be admitted likely for pacemaker placement.  Overall hemodynamically stable at this time.  Discussed case with Dr. Rush Landmark is agreeable to the above plan disposition Final Clinical Impression(s) / ED Diagnoses Final diagnoses:  Heart block    Rx / DC Orders ED Discharge Orders    None        Mare Ferrari, PA-C 10/14/20 1100    Tegeler, Canary Brim, MD 10/14/20 [phone]

## 2020-10-14 NOTE — ED Notes (Signed)
Cath lab nurses at pt bedside to transport to procedure

## 2020-10-14 NOTE — ED Notes (Signed)
Cardiology at bedside.

## 2020-10-14 NOTE — Telephone Encounter (Signed)
Spoke with Dr. Servando Salina and Dr. Tomie China regarding monitor. Pt has been advised to go to [facility] ED for evaluation of complete heartblock on zio monitor. Pt advised to call 911 if she does not have transportation and not to delay her arrival. Pt advised on the seriousness of condition. Pt verbalized understanding and had no additional questions.

## 2020-10-14 NOTE — ED Triage Notes (Signed)
Pt sent here by dr for her heart rate being in the 20s per a heart monitor. Pt endorses weakness and lightheadedness.

## 2020-10-15 ENCOUNTER — Observation Stay (HOSPITAL_COMMUNITY): Payer: Commercial Managed Care - PPO

## 2020-10-15 ENCOUNTER — Encounter (HOSPITAL_COMMUNITY): Payer: Self-pay | Admitting: Cardiology

## 2020-10-15 DIAGNOSIS — Z79899 Other long term (current) drug therapy: Secondary | ICD-10-CM | POA: Diagnosis not present

## 2020-10-15 DIAGNOSIS — I442 Atrioventricular block, complete: Secondary | ICD-10-CM | POA: Diagnosis present

## 2020-10-15 DIAGNOSIS — Z950 Presence of cardiac pacemaker: Secondary | ICD-10-CM

## 2020-10-15 DIAGNOSIS — R001 Bradycardia, unspecified: Secondary | ICD-10-CM

## 2020-10-15 DIAGNOSIS — I10 Essential (primary) hypertension: Secondary | ICD-10-CM

## 2020-10-15 DIAGNOSIS — I441 Atrioventricular block, second degree: Secondary | ICD-10-CM | POA: Diagnosis not present

## 2020-10-15 DIAGNOSIS — Z20822 Contact with and (suspected) exposure to covid-19: Secondary | ICD-10-CM | POA: Diagnosis not present

## 2020-10-15 HISTORY — DX: Bradycardia, unspecified: R00.1

## 2020-10-15 HISTORY — DX: Presence of cardiac pacemaker: Z95.0

## 2020-10-15 HISTORY — DX: Atrioventricular block, complete: I44.2

## 2020-10-15 LAB — BASIC METABOLIC PANEL
Anion gap: 8 (ref 5–15)
BUN: 39 mg/dL — ABNORMAL HIGH (ref 6–20)
CO2: 25 mmol/L (ref 22–32)
Calcium: 9.4 mg/dL (ref 8.9–10.3)
Chloride: 108 mmol/L (ref 98–111)
Creatinine, Ser: 1.67 mg/dL — ABNORMAL HIGH (ref 0.44–1.00)
GFR, Estimated: 35 mL/min — ABNORMAL LOW (ref >60)
Glucose, Bld: 121 mg/dL — ABNORMAL HIGH (ref 70–99)
Potassium: 4 mmol/L (ref 3.5–5.1)
Sodium: 141 mmol/L (ref 135–145)

## 2020-10-15 MED ORDER — AMLODIPINE BESYLATE 10 MG PO TABS: 10.0000 mg | ORAL_TABLET | Freq: Every day | ORAL | 3 refills | Status: DC

## 2020-10-15 MED ORDER — AMLODIPINE BESYLATE 10 MG PO TABS
10.0000 mg | ORAL_TABLET | Freq: Every day | ORAL | Status: DC
  Administered 2020-10-15: 10 mg via ORAL
  Filled 2020-10-15: qty 1

## 2020-10-15 NOTE — Progress Notes (Signed)
Discharge instructions provided to patient. Follow-up and site care reviewed. All questions answered. IV removed. Patient to be escorted home by patient.   Allegra Grana RN

## 2020-10-15 NOTE — Discharge Summary (Signed)
Discharge Summary    Patient ID: Cynthia Archer MRN: [id number]; DOB: 12-11-1961  Admit date: 10/14/2020 Discharge date: 10/15/2020  Primary Care Provider: Dema Severin, NP  Primary Cardiologist: Thomasene Ripple, DO  Primary Electrophysiologist:  Regan Lemming, MD   Discharge Diagnoses    Principal Problem:   Symptomatic bradycardia Active Problems:   S/P placement of cardiac pacemaker, medtronic 10/14/20   Essential hypertension   Second degree AV block   CHB (complete heart block) (HCC)    Diagnostic Studies/Procedures    PREPROCEDURE DIAGNOSIS:  Second degree AV block    POSTPROCEDURE DIAGNOSIS:  Second degree AV block     PROCEDURES:   1. Pacemaker implantation.     INTRODUCTION: Cynthia Archer is a 59 y.o. female  with a history of bradycardia who presents today for pacemaker implantation.  The patient reports intermittent episodes of shortness of breath over the past few months.  No reversible causes have been identified.  The patient therefore presents today for pacemaker implantation.     DESCRIPTION OF PROCEDURE:  Informed written consent was obtained, and   the patient was brought to the electrophysiology lab in a fasting state.  The patient required no sedation for the procedure today.  The patients left chest was prepped and draped in the usual sterile fashion by the EP lab staff. The skin overlying the left deltopectoral region was infiltrated with lidocaine for local analgesia.  A 4-cm incision was made over the left deltopectoral region.  A left subcutaneous pacemaker pocket was fashioned using a combination of sharp and blunt dissection. Electrocautery was required to assure hemostasis.    RA/RV Lead Placement: The left axillary vein was cannulated.  Through the left axillary vein, a Medtronic model 5076 (serial number [id number] [id number]) right atrial lead and a Medtronic model 5076 (serial number PJN [id number]) right ventricular lead were advanced with  fluoroscopic visualization into the right atrial appendage and right ventricular apex positions respectively.  Initial atrial lead P- waves measured 2.52mV with impedance of 813 ohms and a threshold of 0.7 V at 0.5 msec.  Right ventricular lead R-waves measured 9.1 mV with an impedance of 1197 ohms and a threshold of 1 V at 0.5 msec.  Both leads were secured to the pectoralis fascia using #2-0 silk over the suture sleeves.   Device Placement:  The leads were then connected to a Medtronic Azure XT DR MRI SureScan (serial number [id number]) pacemaker.  The pocket was irrigated with copious gentamicin solution.  The pacemaker was then placed into the pocket.  The pocket was then closed in 3 layers with 2.0 Vicryl suture for the subcutaneous and 3.0 Vicryl suture subcuticular layers.  Steri-Strips and a sterile dressing were then applied. EBL<64ml. There were no early apparent complications.     CONCLUSIONS:   1. Successful implantation of a Medtronic Azure XT DR MRI SureScan dual-chamber pacemaker for symptomatic bradycardia  2. No early apparent complications.      _____________   History of Present Illness     Cynthia Archer is a 59 y.o. female with a hx of HTN, hypothyroidism, obesity, Mobitz I.  She was in Mobitz I when presented for stress test and testing deferred.  Echo with normal EF and no significant valve disease.  Stress test changed to cardiac CTA and not yet done.    Event monitor applied and + second and third degree AV block  Also 10 beats of NSVT.  Also PACs and atrial tachycardia.  She was seen by Dr. Elberta Fortis and pt was symptomatic with weakness and DOE.  Also with lightheadedness and some dizziness.  No syncope.  With her symptomatic Bradycardia with 2'1 AV block and CHB PPM discussion with pt resulting in pt agreeing to PPM.  Her  Thyroid was normal. Pt not on any rate slowing medications.   She presented to [facility] 10/14/20 and underwent placement of Medtronic Azure XT DR MRI  sureScan dual chamber PPM - placed in tele bed for overnight monitoring.   Hospital Course     Consultants: none   Pt was seen today and pacer site was stable.  Pacer interrogation was with good numbers.  CXR without pneumothorax.  She was seen and evaluated by Dr. Lalla Brothers and found stable for discharge.  She had amlodipine added to meds and ARB was held due to elevated Cr.  and will need BMP in 1 week at her PCP office. Pt aware  Did the patient have an acute coronary syndrome (MI, NSTEMI, STEMI, etc) this admission?:  No                               Did the patient have a percutaneous coronary intervention (stent / angioplasty)?:  No.   _____________  Discharge Vitals Blood pressure (!) 173/78, pulse 76, temperature 97.8 F (36.6 C), temperature source Oral, resp. rate 15, weight 106.1 kg, SpO2 96 %.  Filed Weights   10/14/20 1917  Weight: 106.1 kg    Labs & Radiologic Studies    CBC Recent Labs    10/14/20 0919  WBC 8.5  HGB 12.9  HCT 42.1  MCV 97.2  PLT 313   Basic Metabolic Panel Recent Labs    17/51/02 0919 10/15/20 0320  NA 143 141  K 4.5 4.0  CL 108 108  CO2 24 25  GLUCOSE 104* 121*  BUN 45* 39*  CREATININE 1.74* 1.67*  CALCIUM 10.2 9.4  MG 1.9  --    Liver Function Tests No results for input(s): AST, ALT, ALKPHOS, BILITOT, PROT, ALBUMIN in the last 72 hours. No results for input(s): LIPASE, AMYLASE in the last 72 hours. High Sensitivity Troponin:   Recent Labs  Lab 10/14/20 0919 10/14/20 1122  TROPONINIHS 16 19*    BNP Invalid input(s): POCBNP D-Dimer No results for input(s): DDIMER in the last 72 hours. Hemoglobin A1C No results for input(s): HGBA1C in the last 72 hours. Fasting Lipid Panel No results for input(s): CHOL, HDL, LDLCALC, TRIG, CHOLHDL, LDLDIRECT in the last 72 hours. Thyroid Function Tests Recent Labs    10/14/20 0929  TSH 2.587   _____________  DG Chest 2 View  Result Date: 10/15/2020 CLINICAL DATA:  S/P pacemaker  insertion EXAM: CHEST - 2 VIEW COMPARISON:  10/14/2020 FINDINGS: New left anterior chest wall sequential pacemaker, with the leads projecting within the right atrium and right ventricle. No pneumothorax. Cardiac silhouette normal in size. No mediastinal or hilar masses or evidence of adenopathy. Clear lungs. IMPRESSION: 1. Well-positioned left anterior chest wall sequential pacemaker. 2. No acute cardiopulmonary disease.  No pneumothorax. Electronically Signed   By: Amie Portland M.D.   On: 10/15/2020 10:12   DG Chest 2 View  Result Date: 10/14/2020 CLINICAL DATA:  Irregular heartbeat EXAM: CHEST - 2 VIEW COMPARISON:  10/02/2008 FINDINGS: Heart size upper normal. Vascularity normal. Lungs clear without infiltrate or effusion. IMPRESSION: No acute cardiopulmonary abnormality. Electronically Signed   By: Marlan Palau  M.D.   On: 10/14/2020 10:20   EP PPM/ICD IMPLANT  Result Date: 10/14/2020 SURGEON:  Regan LemmingWill Martin Camnitz, MD   PREPROCEDURE DIAGNOSIS:  Second degree AV block   POSTPROCEDURE DIAGNOSIS:  Second degree AV block    PROCEDURES:  1. Pacemaker implantation.   INTRODUCTION: Cynthia Archer is a 59 y.o. female  with a history of bradycardia who presents today for pacemaker implantation.  The patient reports intermittent episodes of shortness of breath over the past few months.  No reversible causes have been identified.  The patient therefore presents today for pacemaker implantation.   DESCRIPTION OF PROCEDURE:  Informed written consent was obtained, and  the patient was brought to the electrophysiology lab in a fasting state.  The patient required no sedation for the procedure today.  The patients left chest was prepped and draped in the usual sterile fashion by the EP lab staff. The skin overlying the left deltopectoral region was infiltrated with lidocaine for local analgesia.  A 4-cm incision was made over the left deltopectoral region.  A left subcutaneous pacemaker pocket was fashioned using a  combination of sharp and blunt dissection. Electrocautery was required to assure hemostasis.  RA/RV Lead Placement: The left axillary vein was cannulated.  Through the left axillary vein, a Medtronic model 5076 (serial number [id number] [id number]) right atrial lead and a Medtronic model 5076 (serial number [id number] [id number]) right ventricular lead were advanced with fluoroscopic visualization into the right atrial appendage and right ventricular apex positions respectively.  Initial atrial lead P- waves measured 2.553mV with impedance of 813 ohms and a threshold of 0.7 V at 0.5 msec.  Right ventricular lead R-waves measured 9.1 mV with an impedance of 1197 ohms and a threshold of 1 V at 0.5 msec.  Both leads were secured to the pectoralis fascia using #2-0 silk over the suture sleeves. Device Placement:  The leads were then connected to a Medtronic Azure XT DR MRI SureScan (serial number [id number]) pacemaker.  The pocket was irrigated with copious gentamicin solution.  The pacemaker was then placed into the pocket.  The pocket was then closed in 3 layers with 2.0 Vicryl suture for the subcutaneous and 3.0 Vicryl suture subcuticular layers.  Steri-Strips and a sterile dressing were then applied. EBL<7410ml. There were no early apparent complications.   CONCLUSIONS:  1. Successful implantation of a Medtronic Azure XT DR MRI SureScan dual-chamber pacemaker for symptomatic bradycardia  2. No early apparent complications.       Will Jorja LoaMartin Camnitz, MD 10/14/2020 5:34 PM  LONG TERM MONITOR (3-14 DAYS)  Result Date: 10/14/2020 Cynthia ParkerBillie J Grindle, DOB 11/18/1961, MRN [id number] EVENT MONITOR REPORT: Patient was monitored from 09/27/2020 to 10/04/2020. Indication:                    Dizziness and giddiness Ordering physician:  Garwin Brothersajan R Revankar, MD Referring physician:  Garwin Brothersajan R Revankar, MD Baseline rhythm: Sinus Minimum heart rate: 29 BPM.  Average heart rate: 63 BPM.  Maximal heart rate 218 BPM. Atrial arrhythmia: PACs were seen.   Atrial runs were also noted the longest was 21 seconds and the fastest was 13 beats at a rate of 214.  Also there was activity suggestive of atrial tachycardia with variable block Ventricular arrhythmia: 2 episodes of ventricular tachycardia the longest lasting 10 beats.  Average heart rate 123.  Ventricular bigeminy and trigeminy was also noted Conduction abnormality: Significant conduction abnormalities were noted especially in the light of indication for this testing.  They included atrial tachycardia with variable block, AV block of the second and third degree were noted. Symptoms: As mentioned above dizziness and giddiness Conclusion: Markedly abnormal event monitor with episodes of atrial and ventricular tachycardia as noted above.  More significant is the high-grade AV block especially in the setting of significant symptoms for which the monitoring was done. The referring physician Dr Servando Salina is already aware of the findings and has made arrangements for the patient to be notified.  I have also discussed this report with Dr Servando Salina.  She has contacted our electrophysiology colleagues and and made them aware. Interpreting  cardiologist: Garwin Brothers, MD Date: 10/14/2020 9:02 AM  Disposition   Pt is being discharged home today in good condition.  Follow-up Plans & Appointments     Follow-up Information    [facility] Follow up.   Specialty: Cardiology Why: 10/22/2020 @ 8:40AM, wound check visit Contact information: [address] [place] [address] [phone]       Regan Lemming, MD Follow up.   Specialty: Cardiology Why: you will be called by Dr. Gershon Crane scheduler to make a 3 mo follow up visit Contact information: [address] [place] [address] [phone]                Discharge Medications   Allergies as of 10/15/2020   No Known Allergies     Medication List    STOP taking these medications     olmesartan 20 MG tablet Commonly known as: BENICAR     TAKE these medications   acetaminophen 500 MG tablet Commonly known as: TYLENOL Take by mouth every 6 (six) hours as needed for headache (pain).   amLODipine 10 MG tablet Commonly known as: NORVASC Take 1 tablet (10 mg total) by mouth daily.   furosemide 20 MG tablet Commonly known as: LASIX Take one tablet on tuesdays, thursdays, and saturdays. What changed:   how much to take  how to take this  when to take this  additional instructions   levothyroxine 50 MCG tablet Commonly known as: SYNTHROID Take 50 mcg by mouth daily at 6 (six) AM.   naproxen sodium 220 MG tablet Commonly known as: ALEVE Take 440 mg by mouth 2 (two) times daily with a meal.   nitroGLYCERIN 0.4 MG SL tablet Commonly known as: NITROSTAT Place 1 tablet (0.4 mg total) under the tongue every 5 (five) minutes as needed for chest pain.   omeprazole 40 MG capsule Commonly known as: PRILOSEC Take 40 mg by mouth daily at 6 (six) AM.   potassium chloride SA 20 MEQ tablet Commonly known as: KLOR-CON Take one tablet on Tuesday, Thursday, and Saturday. What changed:   how much to take  how to take this  when to take this  additional instructions   rosuvastatin 10 MG tablet Commonly known as: CRESTOR Take 10 mg by mouth daily at 6 (six) AM.   Vitamin D3 1.25 MG (50000 UT) Caps Take 50,000 Units by mouth every Sunday.          Outstanding Labs/Studies   BMP in 1 week.   Duration of Discharge Encounter   Greater than 30 minutes including physician time.  Signed, Nada Boozer, NP 10/15/2020, 10:41 AM

## 2020-10-15 NOTE — Progress Notes (Addendum)
Progress Note  Patient Name: Cynthia Archer Date of Encounter: 10/15/2020  [facility] Cardiologist: Thomasene Ripple, DO  Will Camnitz  Subjective   Doing well post DDD pacemaker implant.  Inpatient Medications    Scheduled Meds: . [START ON 10/18/2020] furosemide  20 mg Oral Once per day on Tue Thu  . levothyroxine  50 mcg Oral Q0600  . naproxen  500 mg Oral BID WC  . pantoprazole  40 mg Oral Daily  . [START ON 10/18/2020] potassium chloride SA  20 mEq Oral Once per day on Tue Thu  . rosuvastatin  10 mg Oral Q0600  . sodium chloride flush  3 mL Intravenous Q12H  . [START ON 10/16/2020] Vitamin D (Ergocalciferol)  50,000 Units Oral Q Sun   Continuous Infusions: .  ceFAZolin (ANCEF) IV 1 g (10/15/20 0819)   PRN Meds: acetaminophen, nitroGLYCERIN, ondansetron (ZOFRAN) IV   Vital Signs    Vitals:   10/14/20 1917 10/15/20 0100 10/15/20 0305 10/15/20 0500  BP: 136/74 (!) 148/70 130/69   Pulse: (!) 103 79  78  Resp: 20 (!) 27 16   Temp: 98.1 F (36.7 C) 97.8 F (36.6 C) 97.8 F (36.6 C)   TempSrc: Oral Oral Oral Oral  SpO2: 96% 95%    Weight: 106.1 kg      No intake or output data in the 24 hours ending 10/15/20 0821 Last 3 Weights 10/14/2020 09/27/2020  Weight (lbs) 234 lb 234 lb  Weight (kg) 106.142 kg 106.142 kg      Telemetry    AsVp - Personally Reviewed  ECG      Physical Exam  Paced at 72.  GEN: No acute distress.   Neck: No JVD Cardiac: RRR, no murmurs, rubs, or gallops. Pocket without hematoma. Respiratory: Clear to auscultation bilaterally. GI: Soft, nontender, non-distended  MS: No edema; No deformity. Neuro:  Nonfocal  Psych: Normal affect   Labs    High Sensitivity Troponin:   Recent Labs  Lab 10/14/20 0919 10/14/20 1122  TROPONINIHS 16 19*      Chemistry Recent Labs  Lab 10/10/20 0812 10/14/20 0919 10/15/20 0320  NA 142 143 141  K 4.4 4.5 4.0  CL 105 108 108  CO2 28 24 25   GLUCOSE 122* 104* 121*  BUN 33* 45* 39*    CREATININE 1.46* 1.74* 1.67*  CALCIUM 10.0 10.2 9.4  GFRNONAA 41* 33* 35*  ANIONGAP 9 11 8      Hematology Recent Labs  Lab 10/14/20 0919  WBC 8.5  RBC 4.33  HGB 12.9  HCT 42.1  MCV 97.2  MCH 29.8  MCHC 30.6  RDW 13.2  PLT 313    BNPNo results for input(s): BNP, PROBNP in the last 168 hours.   DDimer No results for input(s): DDIMER in the last 168 hours.   Radiology    DG Chest 2 View  Result Date: 10/14/2020 CLINICAL DATA:  Irregular heartbeat EXAM: CHEST - 2 VIEW COMPARISON:  10/02/2008 FINDINGS: Heart size upper normal. Vascularity normal. Lungs clear without infiltrate or effusion. IMPRESSION: No acute cardiopulmonary abnormality. Electronically Signed   By: 10/16/2020 M.D.   On: 10/14/2020 10:20   EP PPM/ICD IMPLANT  Result Date: 10/14/2020 SURGEON:  Will 10/16/2020, MD   PREPROCEDURE DIAGNOSIS:  Second degree AV block   POSTPROCEDURE DIAGNOSIS:  Second degree AV block    PROCEDURES:  1. Pacemaker implantation.   INTRODUCTION: Cynthia Archer is a 59 y.o. female  with a history of bradycardia  who presents today for pacemaker implantation.  The patient reports intermittent episodes of shortness of breath over the past few months.  No reversible causes have been identified.  The patient therefore presents today for pacemaker implantation.   DESCRIPTION OF PROCEDURE:  Informed written consent was obtained, and  the patient was brought to the electrophysiology lab in a fasting state.  The patient required no sedation for the procedure today.  The patients left chest was prepped and draped in the usual sterile fashion by the EP lab staff. The skin overlying the left deltopectoral region was infiltrated with lidocaine for local analgesia.  A 4-cm incision was made over the left deltopectoral region.  A left subcutaneous pacemaker pocket was fashioned using a combination of sharp and blunt dissection. Electrocautery was required to assure hemostasis.  RA/RV Lead Placement:  The left axillary vein was cannulated.  Through the left axillary vein, a Medtronic model 5076 (serial number [id number] [id number]) right atrial lead and a Medtronic model 5076 (serial number [id number] [id number]) right ventricular lead were advanced with fluoroscopic visualization into the right atrial appendage and right ventricular apex positions respectively.  Initial atrial lead P- waves measured 2.15mV with impedance of 813 ohms and a threshold of 0.7 V at 0.5 msec.  Right ventricular lead R-waves measured 9.1 mV with an impedance of 1197 ohms and a threshold of 1 V at 0.5 msec.  Both leads were secured to the pectoralis fascia using #2-0 silk over the suture sleeves. Device Placement:  The leads were then connected to a Medtronic Azure XT DR MRI SureScan (serial number [id number]) pacemaker.  The pocket was irrigated with copious gentamicin solution.  The pacemaker was then placed into the pocket.  The pocket was then closed in 3 layers with 2.0 Vicryl suture for the subcutaneous and 3.0 Vicryl suture subcuticular layers.  Steri-Strips and a sterile dressing were then applied. EBL<21ml. There were no early apparent complications.   CONCLUSIONS:  1. Successful implantation of a Medtronic Azure XT DR MRI SureScan dual-chamber pacemaker for symptomatic bradycardia  2. No early apparent complications.       Will Jorja Loa, MD 10/14/2020 5:34 PM   Cardiac Studies   n/a  Patient Profile and Plan     59 y.o. female s/p DDD PPM implant for symptomatic 2nd degree AV block. Doing well. D/C today.  1. 2nd degree AV block S/p PPM  2. HTN Holding ARB due to elevated Cr. Recheck labs in 1 week as outpatient. Starting amlodipine 10mg  daily for BP  For questions or updates, please contact [facility] Please consult [email or web address] for contact info under        Signed, , MD  10/15/2020, 8:21 AM

## 2020-10-17 ENCOUNTER — Encounter (HOSPITAL_COMMUNITY): Payer: Self-pay | Admitting: Cardiology

## 2020-10-17 MED FILL — Lidocaine HCl Local Inj 1%: INTRAMUSCULAR | Qty: 80 | Status: AC

## 2020-10-24 ENCOUNTER — Ambulatory Visit (HOSPITAL_COMMUNITY): Admission: RE | Admit: 2020-10-24 | Payer: Commercial Managed Care - PPO | Source: Ambulatory Visit

## 2020-11-01 ENCOUNTER — Other Ambulatory Visit: Payer: Self-pay

## 2020-11-01 ENCOUNTER — Ambulatory Visit (INDEPENDENT_AMBULATORY_CARE_PROVIDER_SITE_OTHER): Payer: Commercial Managed Care - PPO | Admitting: Emergency Medicine

## 2020-11-01 DIAGNOSIS — I442 Atrioventricular block, complete: Secondary | ICD-10-CM

## 2020-11-01 LAB — CUP PACEART INCLINIC DEVICE CHECK
Battery Remaining Longevity: 137 mo
Battery Voltage: 3.21 V
Brady Statistic AP VP Percent: 2.96 %
Brady Statistic AP VS Percent: 0.01 %
Brady Statistic AS VP Percent: 94.99 %
Brady Statistic AS VS Percent: 2.04 %
Brady Statistic RA Percent Paced: 2.99 %
Brady Statistic RV Percent Paced: 97.95 %
Date Time Interrogation Session: 20211116090945
Implantable Lead Implant Date: 20211029
Implantable Lead Implant Date: 20211029
Implantable Lead Location: 753859
Implantable Lead Location: 753860
Implantable Lead Model: 5076
Implantable Lead Model: 5076
Implantable Pulse Generator Implant Date: 20211029
Lead Channel Impedance Value: 323 Ohm
Lead Channel Impedance Value: 399 Ohm
Lead Channel Impedance Value: 418 Ohm
Lead Channel Impedance Value: 532 Ohm
Lead Channel Pacing Threshold Amplitude: 0.625 V
Lead Channel Pacing Threshold Amplitude: 0.625 V
Lead Channel Pacing Threshold Pulse Width: 0.4 ms
Lead Channel Pacing Threshold Pulse Width: 0.4 ms
Lead Channel Sensing Intrinsic Amplitude: 11.125 mV
Lead Channel Sensing Intrinsic Amplitude: 14 mV
Lead Channel Sensing Intrinsic Amplitude: 2.125 mV
Lead Channel Sensing Intrinsic Amplitude: 3.375 mV
Lead Channel Setting Pacing Amplitude: 3.5 V
Lead Channel Setting Pacing Amplitude: 3.5 V
Lead Channel Setting Pacing Pulse Width: 0.4 ms
Lead Channel Setting Sensing Sensitivity: 1.2 mV

## 2020-11-01 NOTE — Progress Notes (Signed)
Wound check appointment. Steri-strips removed. Wound without redness or edema. Incision edges approximated, wound well healed. Normal device function. Thresholds, sensing, and impedances consistent with implant measurements. Device programmed at 3.5V for extra safety margin until 3 month visit. Histogram distribution appropriate for patient and level of activity. No mode switches.  2HVR episodes on 10/31 appear NSVT, longest episode was 16 beats.  Device clock adjusted for DST.  Patient educated about wound care, arm mobility, lifting restrictions. Patient is enrolled in remote monitoring, next scheduled check 01/18/21, ROV with Dr. Elberta Fortis on 01/23/21 in [place].

## 2020-11-21 DIAGNOSIS — E079 Disorder of thyroid, unspecified: Secondary | ICD-10-CM | POA: Insufficient documentation

## 2020-11-23 ENCOUNTER — Ambulatory Visit: Payer: Commercial Managed Care - PPO | Admitting: Cardiology

## 2020-11-23 ENCOUNTER — Encounter: Payer: Self-pay | Admitting: Cardiology

## 2020-11-23 ENCOUNTER — Other Ambulatory Visit: Payer: Self-pay

## 2020-11-23 VITALS — BP 140/88 | HR 89 | Ht 60.0 in | Wt 233.6 lb

## 2020-11-23 DIAGNOSIS — Z950 Presence of cardiac pacemaker: Secondary | ICD-10-CM | POA: Diagnosis not present

## 2020-11-23 DIAGNOSIS — I10 Essential (primary) hypertension: Secondary | ICD-10-CM

## 2020-11-23 DIAGNOSIS — I442 Atrioventricular block, complete: Secondary | ICD-10-CM

## 2020-11-23 DIAGNOSIS — I5030 Unspecified diastolic (congestive) heart failure: Secondary | ICD-10-CM

## 2020-11-23 MED ORDER — POTASSIUM CHLORIDE CRYS ER 20 MEQ PO TBCR
EXTENDED_RELEASE_TABLET | ORAL | 3 refills | Status: DC
Start: 2020-11-23 — End: 2021-04-07

## 2020-11-23 MED ORDER — FUROSEMIDE 20 MG PO TABS
ORAL_TABLET | ORAL | 6 refills | Status: DC
Start: 2020-11-23 — End: 2020-12-08

## 2020-11-23 NOTE — Patient Instructions (Addendum)
Medication Instructions:  Your physician has recommended you make the following change in your medication:   Increase your lasix to 40 mg (2 tabs.) daily for 2 weeks then decrease to 20 mg on Tuesday, Thursday and Saturdays. Start potassium chloride 20 mEq daily.  *If you need a refill on your cardiac medications before your next appointment, please call your pharmacy*   Lab Work: Your physician recommends that you have labs done in the office today. Your test included  basic metabolic panel, magnesium and BNP.  If you have labs (blood work) drawn today and your tests are completely normal, you will receive your results only by: Marland Kitchen MyChart Message (if you have MyChart) OR . A paper copy in the mail If you have any lab test that is abnormal or we need to change your treatment, we will call you to review the results.   Testing/Procedures: None ordered   Follow-Up: At [facility], you and your health needs are our priority.  As part of our continuing mission to provide you with exceptional heart care, we have created designated Provider Care Teams.  These Care Teams include your primary Cardiologist (physician) and Advanced Practice Providers (APPs -  Physician Assistants and Nurse Practitioners) who all work together to provide you with the care you need, when you need it.  We recommend signing up for the patient portal called "MyChart".  Sign up information is provided on this After Visit Summary.  MyChart is used to connect with patients for Virtual Visits (Telemedicine).  Patients are able to view lab/test results, encounter notes, upcoming appointments, etc.  Non-urgent messages can be sent to your provider as well.   To learn more about what you can do with MyChart, go to [email or web address].    Your next appointment:   2 week(s)  The format for your next appointment:   In Person  Provider:   Belva Crome, MD   Other Instructions NA

## 2020-11-23 NOTE — Progress Notes (Signed)
Cardiology Office Note:    Date:  11/23/2020   ID:  Cynthia Archer, DOB November 28, 1961, MRN [id number]  PCP:  Dema Severin, NP  Cardiologist:  Thomasene Ripple, DO  Electrophysiologist:  Regan Lemming, MD   Referring MD: Dema Severin, NP   " I am having shortness of breath"   History of Present Illness:    Cynthia Archer is a 59 y.o. female with a hx of complete heart block she is status post pacemaker Medtronic in October 2021, hypertension, hyperlipidemia, diastolic heart failure.  The patient initially reported with shortness of breath and palpitation her EKG showed evidence of 2-1 AV block which were intermittent, at which time we will place a monitor on the patient which did show complete heart block she was referred to the emergency department [facility] underwent pacemaker placement for Medtronic device she is here today for follow-up visit.  She tells me she has been experiencing significant shortness of breath on exertion and some bilateral leg edema.  No chest pain.  Past Medical History:  Diagnosis Date  . CHB (complete heart block) (HCC) 10/15/2020  . Degeneration of lumbar intervertebral disc 02/08/2020  . Essential hypertension 02/26/2017  . Hypercalcemia 02/26/2017  . Hyperparathyroidism (HCC) 02/26/2017  . S/P placement of cardiac pacemaker, medtronic 10/14/20 10/15/2020  . Second degree AV block 10/14/2020  . Symptomatic bradycardia 10/15/2020  . Thyroid disease   . Vitamin D deficiency 02/26/2017    Past Surgical History:  Procedure Laterality Date  . ANKLE SURGERY    . PACEMAKER IMPLANT N/A 10/14/2020   Procedure: PACEMAKER IMPLANT;  Surgeon: Regan Lemming, MD;  Location: [facility] INVASIVE [facility];  Service: Cardiovascular;  Laterality: N/A;  . THYROID SURGERY    . TUBAL LIGATION      Current Medications: Current Meds  Medication Sig  . acetaminophen (TYLENOL) 500 MG tablet Take by mouth every 6 (six) hours as needed for headache (pain).  .  Cholecalciferol (VITAMIN D3) 1.25 MG (50000 UT) CAPS Take 50,000 Units by mouth every Sunday.  . furosemide (LASIX) 20 MG tablet Take 40 mg (2 tablets daily for 2 weeks then take one tablet on Tuesdays, Thursdays, and Saturdays.  Marland Kitchen levothyroxine (SYNTHROID) 50 MCG tablet Take 50 mcg by mouth daily at 6 (six) AM.   . naproxen sodium (ALEVE) 220 MG tablet Take 440 mg by mouth 2 (two) times daily with a meal.  . nitroGLYCERIN (NITROSTAT) 0.4 MG SL tablet Place 1 tablet (0.4 mg total) under the tongue every 5 (five) minutes as needed for chest pain.  Marland Kitchen omeprazole (PRILOSEC) 40 MG capsule Take 40 mg by mouth daily at 6 (six) AM.   . potassium chloride SA (KLOR-CON) 20 MEQ tablet Take one tablet daily.  . rosuvastatin (CRESTOR) 10 MG tablet Take 10 mg by mouth daily at 6 (six) AM.  . [DISCONTINUED] furosemide (LASIX) 20 MG tablet Take one tablet on tuesdays, thursdays, and saturdays. (Patient taking differently: Take 20 mg by mouth See admin instructions. Take one tablet (20 mg) by mouth on Tuesday and Thursday)  . [DISCONTINUED] potassium chloride SA (KLOR-CON) 20 MEQ tablet Take one tablet on Tuesday, Thursday, and Saturday. (Patient taking differently: Take 20 mEq by mouth See admin instructions. Take one tablet (20 meq) by mouth on Tuesday and Thursday)     Allergies:   Patient has no known allergies.   Social History   Socioeconomic History  . Marital status: Married    Spouse name: Not  on file  . Number of children: Not on file  . Years of education: Not on file  . Highest education level: Not on file  Occupational History  . Not on file  Tobacco Use  . Smoking status: Never Smoker  . Smokeless tobacco: Never Used  Substance and Sexual Activity  . Alcohol use: Not Currently  . Drug use: Never  . Sexual activity: Not on file  Other Topics Concern  . Not on file  Social History Narrative  . Not on file   Social Determinants of Health   Financial Resource Strain:   . Difficulty of  Paying Living Expenses: Not on file  Food Insecurity:   . Worried About Programme researcher, broadcasting/film/video in the Last Year: Not on file  . Ran Out of Food in the Last Year: Not on file  Transportation Needs:   . Lack of Transportation (Medical): Not on file  . Lack of Transportation (Non-Medical): Not on file  Physical Activity:   . Days of Exercise per Week: Not on file  . Minutes of Exercise per Session: Not on file  Stress:   . Feeling of Stress : Not on file  Social Connections:   . Frequency of Communication with Friends and Family: Not on file  . Frequency of Social Gatherings with Friends and Family: Not on file  . Attends Religious Services: Not on file  . Active Member of Clubs or Organizations: Not on file  . Attends Banker Meetings: Not on file  . Marital Status: Not on file     Family History: The patient's family history includes Alzheimer's disease in her maternal grandmother; COPD in her father; Heart disease in her mother; Kidney failure in her mother; Lung cancer in her father, maternal grandfather, and paternal grandfather.  ROS:   Review of Systems  Constitution: Negative for decreased appetite, fever and weight gain.  HENT: Negative for congestion, ear discharge, hoarse voice and sore throat.   Eyes: Negative for discharge, redness, vision loss in right eye and visual halos.  Cardiovascular: Negative for chest pain, dyspnea on exertion, leg swelling, orthopnea and palpitations.  Respiratory: Negative for cough, hemoptysis, shortness of breath and snoring.   Endocrine: Negative for heat intolerance and polyphagia.  Hematologic/Lymphatic: Negative for bleeding problem. Does not bruise/bleed easily.  Skin: Negative for flushing, nail changes, rash and suspicious lesions.  Musculoskeletal: Negative for arthritis, joint pain, muscle cramps, myalgias, neck pain and stiffness.  Gastrointestinal: Negative for abdominal pain, bowel incontinence, diarrhea and excessive  appetite.  Genitourinary: Negative for decreased libido, genital sores and incomplete emptying.  Neurological: Negative for brief paralysis, focal weakness, headaches and loss of balance.  Psychiatric/Behavioral: Negative for altered mental status, depression and suicidal ideas.  Allergic/Immunologic: Negative for HIV exposure and persistent infections.    EKGs/Labs/Other Studies Reviewed:    The following studies were reviewed today:   EKG: None today  Her echocardiogram which was done in October 2021 at the point of hospital was unremarkable EF 60 to 65%.  Recent Labs: 09/27/2020: NT-Pro BNP 736 10/14/2020: Hemoglobin 12.9; Magnesium 1.9; Platelets 313; TSH 2.587 10/15/2020: BUN 39; Creatinine, Ser 1.67; Potassium 4.0; Sodium 141  Recent Lipid Panel No results found for: CHOL, TRIG, HDL, CHOLHDL, VLDL, LDLCALC, LDLDIRECT  Physical Exam:    VS:  BP 140/88   Pulse 89   Ht 5' (1.524 m)   Wt 233 lb 9.6 oz (106 kg)   SpO2 98%   BMI 45.62 kg/m  Wt Readings from Last 3 Encounters:  11/23/20 233 lb 9.6 oz (106 kg)  10/14/20 234 lb (106.1 kg)  09/27/20 234 lb (106.1 kg)     GEN: Well nourished, well developed in no acute distress HEENT: Normal NECK: No JVD; No carotid bruits LYMPHATICS: No lymphadenopathy CARDIAC: S1S2 noted,RRR, no murmurs, rubs, gallops RESPIRATORY:  Clear to auscultation without rales, wheezing or rhonchi  ABDOMEN: Soft, non-tender, non-distended, +bowel sounds, no guarding. EXTREMITIES: Bilateral leg edema edema, No cyanosis, no clubbing MUSCULOSKELETAL:  No deformity  SKIN: Warm and dry NEUROLOGIC:  Alert and oriented x 3, non-focal PSYCHIATRIC:  Normal affect, good insight  ASSESSMENT:    1. Diastolic heart failure, unspecified HF chronicity (HCC)   2. Essential hypertension   3. CHB (complete heart block) (HCC)   4. S/P placement of cardiac pacemaker, medtronic 10/14/20    PLAN:     Dizziness has improved and initial shortness of  breath improved but giving her increased bilateral leg edema she has also started experiencing shortness of breath as well. Her physical exam is consistent with +2 bilateral leg edema I like to increase her Lasix to 40 mg daily for the next 2 weeks and then should go back to Lasix 20 mg daily DC TheraCys and Saturdays.  Her potassium supplement will also be increased. She has not had any chest pain.  Her coronary CTA was not scheduled.  She plans to get this testing done as well.  She will follow up in 2 weeks and further arrangements will be made as well. The patient understands the need to lose weight with diet and exercise. We have discussed specific strategies for this. Blood work will be done today to assess kidney function electrolytes.  The patient is in agreement with the above plan. The patient left the office in stable condition.  The patient will follow up in 2 weeks due to diuretics change.   Medication Adjustments/Labs and Tests Ordered: Current medicines are reviewed at length with the patient today.  Concerns regarding medicines are outlined above.  Orders Placed This Encounter  Procedures  . Basic metabolic panel  . Brain natriuretic peptide  . Magnesium   Meds ordered this encounter  Medications  . potassium chloride SA (KLOR-CON) 20 MEQ tablet    Sig: Take one tablet daily.    Dispense:  30 tablet    Refill:  3  . furosemide (LASIX) 20 MG tablet    Sig: Take 40 mg (2 tablets daily for 2 weeks then take one tablet on Tuesdays, Thursdays, and Saturdays.    Dispense:  30 tablet    Refill:  6    Patient Instructions  Medication Instructions:  Your physician has recommended you make the following change in your medication:   Increase your lasix to 40 mg (2 tabs.) daily for 2 weeks then decrease to 20 mg on Tuesday, Thursday and Saturdays. Start potassium chloride 20 mEq daily.  *If you need a refill on your cardiac medications before your next appointment, please call  your pharmacy*   Lab Work: Your physician recommends that you have labs done in the office today. Your test included  basic metabolic panel, magnesium and BNP.  If you have labs (blood work) drawn today and your tests are completely normal, you will receive your results only by: Marland Kitchen MyChart Message (if you have MyChart) OR . A paper copy in the mail If you have any lab test that is abnormal or we need to change your treatment,  we will call you to review the results.   Testing/Procedures: None ordered   Follow-Up: At [facility], you and your health needs are our priority.  As part of our continuing mission to provide you with exceptional heart care, we have created designated Provider Care Teams.  These Care Teams include your primary Cardiologist (physician) and Advanced Practice Providers (APPs -  Physician Assistants and Nurse Practitioners) who all work together to provide you with the care you need, when you need it.  We recommend signing up for the patient portal called "MyChart".  Sign up information is provided on this After Visit Summary.  MyChart is used to connect with patients for Virtual Visits (Telemedicine).  Patients are able to view lab/test results, encounter notes, upcoming appointments, etc.  Non-urgent messages can be sent to your provider as well.   To learn more about what you can do with MyChart, go to [email or web address].    Your next appointment:   2 week(s)  The format for your next appointment:   In Person  Provider:   Belva Cromeajan Revankar, MD   Other Instructions NA     Adopting a Healthy Lifestyle.  Know what a healthy weight is for you (roughly BMI <25) and aim to maintain this   Aim for 7+ servings of fruits and vegetables daily   65-80+ fluid ounces of water or unsweet tea for healthy kidneys   Limit to max 1 drink of alcohol per day; avoid smoking/tobacco   Limit animal fats in diet for cholesterol and heart health - choose grass fed  whenever available   Avoid highly processed foods, and foods high in saturated/trans fats   Aim for low stress - take time to unwind and care for your mental health   Aim for 150 min of moderate intensity exercise weekly for heart health, and weights twice weekly for bone health   Aim for 7-9 hours of sleep daily   When it comes to diets, agreement about the perfect plan isnt easy to find, even among the experts. Experts at the [employer] developed an idea known as the Healthy Eating Plate. Just imagine a plate divided into logical, healthy portions.   The emphasis is on diet quality:   Load up on vegetables and fruits - one-half of your plate: Aim for color and variety, and remember that potatoes dont count.   Go for whole grains - one-quarter of your plate: Whole wheat, barley, wheat berries, quinoa, oats, brown rice, and foods made with them. If you want pasta, go with whole wheat pasta.   Protein power - one-quarter of your plate: Fish, chicken, beans, and nuts are all healthy, versatile protein sources. Limit red meat.   The diet, however, does go beyond the plate, offering a few other suggestions.   Use healthy plant oils, such as olive, canola, soy, corn, sunflower and peanut. Check the labels, and avoid partially hydrogenated oil, which have unhealthy trans fats.   If youre thirsty, drink water. Coffee and tea are good in moderation, but skip sugary drinks and limit milk and dairy products to one or two daily servings.   The type of carbohydrate in the diet is more important than the amount. Some sources of carbohydrates, such as vegetables, fruits, whole grains, and beans-are healthier than others.   Finally, stay active  Signed, Thomasene RippleKardie Khrystyne Arpin, DO  11/23/2020 2:08 PM    [facility]

## 2020-11-24 LAB — BASIC METABOLIC PANEL
BUN/Creatinine Ratio: 25 (ref 12–28)
BUN: 31 mg/dL — ABNORMAL HIGH (ref 8–27)
CO2: 24 mmol/L (ref 20–29)
Calcium: 10.2 mg/dL (ref 8.7–10.3)
Chloride: 105 mmol/L (ref 96–106)
Creatinine, Ser: 1.25 mg/dL — ABNORMAL HIGH (ref 0.57–1.00)
GFR calc Af Amer: 54 mL/min/{1.73_m2} — ABNORMAL LOW (ref >59)
GFR calc non Af Amer: 47 mL/min/{1.73_m2} — ABNORMAL LOW (ref >59)
Glucose: 110 mg/dL — ABNORMAL HIGH (ref 65–99)
Potassium: 4.2 mmol/L (ref 3.5–5.2)
Sodium: 143 mmol/L (ref 134–144)

## 2020-11-24 LAB — MAGNESIUM: Magnesium: 1.7 mg/dL (ref 1.6–2.3)

## 2020-11-24 LAB — BRAIN NATRIURETIC PEPTIDE: BNP: 33.7 pg/mL (ref 0.0–100.0)

## 2020-11-25 ENCOUNTER — Telehealth: Payer: Self-pay

## 2020-11-25 NOTE — Telephone Encounter (Signed)
Spoke with patient regarding results and recommendation.  Patient verbalizes understanding and is agreeable to plan of care. Advised patient to call back with any issues or concerns.  

## 2020-11-25 NOTE — Telephone Encounter (Signed)
-----   Message from Thomasene Ripple, DO sent at 11/25/2020  3:01 PM EST ----- Your kidney function has improved compared to 1 month ago we will continue to monitor.

## 2020-12-08 ENCOUNTER — Other Ambulatory Visit: Payer: Self-pay

## 2020-12-08 ENCOUNTER — Ambulatory Visit: Payer: Commercial Managed Care - PPO | Admitting: Cardiology

## 2020-12-08 ENCOUNTER — Encounter: Payer: Self-pay | Admitting: Cardiology

## 2020-12-08 VITALS — BP 142/88 | HR 80 | Ht 60.0 in | Wt 232.0 lb

## 2020-12-08 DIAGNOSIS — I5032 Chronic diastolic (congestive) heart failure: Secondary | ICD-10-CM

## 2020-12-08 DIAGNOSIS — I10 Essential (primary) hypertension: Secondary | ICD-10-CM | POA: Diagnosis not present

## 2020-12-08 DIAGNOSIS — I441 Atrioventricular block, second degree: Secondary | ICD-10-CM | POA: Diagnosis not present

## 2020-12-08 DIAGNOSIS — Z950 Presence of cardiac pacemaker: Secondary | ICD-10-CM

## 2020-12-08 DIAGNOSIS — Z79899 Other long term (current) drug therapy: Secondary | ICD-10-CM | POA: Diagnosis not present

## 2020-12-08 MED ORDER — FUROSEMIDE 40 MG PO TABS: ORAL_TABLET | ORAL | 2 refills | Status: DC

## 2020-12-08 NOTE — Patient Instructions (Signed)
Medication Instructions:  Your physician has recommended you make the following change in your medication:   Start: Lasix 40 mg twice daily for 5 days then 40 mg daily  *If you need a refill on your cardiac medications before your next appointment, please call your pharmacy*   Lab Work: Your physician recommends that you return for lab work today: bmp, mg  If you have labs (blood work) drawn today and your tests are completely normal, you will receive your results only by: Marland Kitchen MyChart Message (if you have MyChart) OR . A paper copy in the mail If you have any lab test that is abnormal or we need to change your treatment, we will call you to review the results.   Testing/Procedures: None   Follow-Up: At [facility], you and your health needs are our priority.  As part of our continuing mission to provide you with exceptional heart care, we have created designated Provider Care Teams.  These Care Teams include your primary Cardiologist (physician) and Advanced Practice Providers (APPs -  Physician Assistants and Nurse Practitioners) who all work together to provide you with the care you need, when you need it.  We recommend signing up for the patient portal called "MyChart".  Sign up information is provided on this After Visit Summary.  MyChart is used to connect with patients for Virtual Visits (Telemedicine).  Patients are able to view lab/test results, encounter notes, upcoming appointments, etc.  Non-urgent messages can be sent to your provider as well.   To learn more about what you can do with MyChart, go to [email or web address].    Your next appointment:   3 month(s)  The format for your next appointment:   In Person  Provider:   Thomasene Ripple, DO   Other Instructions

## 2020-12-08 NOTE — Progress Notes (Signed)
Cardiology Office Note:    Date:  12/08/2020   ID:  CHOLE DRIVER, DOB 07/13/1961, MRN [id number]  PCP:  Dema Severin, NP  Cardiologist:  Thomasene Ripple, DO  Electrophysiologist:  Regan Lemming, MD   Referring MD: Dema Severin, NP   Legs are still swollen  History of Present Illness:    Cynthia Archer is a 59 y.o. female with a hx of complete heart block she is status post pacemaker Medtronic in October 2021, hypertension, hyperlipidemia, diastolic heart failure.  I saw the patient on November 23, 2020 she had been experiencing bilateral leg edema with some shortness of breath.  I increased her Lasix to 40 mg daily for 2 weeks and asked her to start back on 20 mg daily.  She is here today for follow-up visit.  During our visit she had not schedule her coronary CTA and I encouraged the patient that this testing was still need to be scheduled.  She tells me today that she still has not had much relief from the increase of Lasix 40 mg daily.  Past Medical History:  Diagnosis Date   CHB (complete heart block) (HCC) 10/15/2020   Degeneration of lumbar intervertebral disc 02/08/2020   Essential hypertension 02/26/2017   Hypercalcemia 02/26/2017   Hyperparathyroidism (HCC) 02/26/2017   S/P placement of cardiac pacemaker, medtronic 10/14/20 10/15/2020   Second degree AV block 10/14/2020   Symptomatic bradycardia 10/15/2020   Thyroid disease    Vitamin D deficiency 02/26/2017    Past Surgical History:  Procedure Laterality Date   ANKLE SURGERY     PACEMAKER IMPLANT N/A 10/14/2020   Procedure: PACEMAKER IMPLANT;  Surgeon: Regan Lemming, MD;  Location: [facility];  Service: Cardiovascular;  Laterality: N/A;   THYROID SURGERY     TUBAL LIGATION      Current Medications: Current Meds  Medication Sig   acetaminophen (TYLENOL) 500 MG tablet Take by mouth every 6 (six) hours as needed for headache (pain).   amLODipine (NORVASC) 10 MG tablet Take 1  tablet (10 mg total) by mouth daily.   Cholecalciferol (VITAMIN D3) 1.25 MG (50000 UT) CAPS Take 50,000 Units by mouth every Sunday.   levothyroxine (SYNTHROID) 50 MCG tablet Take 50 mcg by mouth daily at 6 (six) AM.    naproxen sodium (ALEVE) 220 MG tablet Take 440 mg by mouth 2 (two) times daily with a meal.   nitroGLYCERIN (NITROSTAT) 0.4 MG SL tablet Place 1 tablet (0.4 mg total) under the tongue every 5 (five) minutes as needed for chest pain.   omeprazole (PRILOSEC) 40 MG capsule Take 40 mg by mouth daily at 6 (six) AM.    potassium chloride SA (KLOR-CON) 20 MEQ tablet Take one tablet daily.   rosuvastatin (CRESTOR) 10 MG tablet Take 10 mg by mouth daily at 6 (six) AM.   [DISCONTINUED] furosemide (LASIX) 20 MG tablet Take 40 mg (2 tablets daily for 2 weeks then take one tablet on Tuesdays, Thursdays, and Saturdays.     Allergies:   Patient has no known allergies.   Social History   Socioeconomic History   Marital status: Married    Spouse name: Not on file   Number of children: Not on file   Years of education: Not on file   Highest education level: Not on file  Occupational History   Not on file  Tobacco Use   Smoking status: Never Smoker   Smokeless tobacco: Never Used  Substance and Sexual  Activity   Alcohol use: Not Currently   Drug use: Never   Sexual activity: Not on file  Other Topics Concern   Not on file  Social History Narrative   Not on file   Social Determinants of Health   Financial Resource Strain: Not on file  Food Insecurity: Not on file  Transportation Needs: Not on file  Physical Activity: Not on file  Stress: Not on file  Social Connections: Not on file     Family History: The patient's family history includes Alzheimer's disease in her maternal grandmother; COPD in her father; Heart disease in her mother; Kidney failure in her mother; Lung cancer in her father, maternal grandfather, and paternal grandfather.  ROS:   Review  of Systems  Constitution: Negative for decreased appetite, fever and weight gain.  HENT: Negative for congestion, ear discharge, hoarse voice and sore throat.   Eyes: Negative for discharge, redness, vision loss in right eye and visual halos.  Cardiovascular: Negative for chest pain, dyspnea on exertion, leg swelling, orthopnea and palpitations.  Respiratory: Negative for cough, hemoptysis, shortness of breath and snoring.   Endocrine: Negative for heat intolerance and polyphagia.  Hematologic/Lymphatic: Negative for bleeding problem. Does not bruise/bleed easily.  Skin: Negative for flushing, nail changes, rash and suspicious lesions.  Musculoskeletal: Negative for arthritis, joint pain, muscle cramps, myalgias, neck pain and stiffness.  Gastrointestinal: Negative for abdominal pain, bowel incontinence, diarrhea and excessive appetite.  Genitourinary: Negative for decreased libido, genital sores and incomplete emptying.  Neurological: Negative for brief paralysis, focal weakness, headaches and loss of balance.  Psychiatric/Behavioral: Negative for altered mental status, depression and suicidal ideas.  Allergic/Immunologic: Negative for HIV exposure and persistent infections.    EKGs/Labs/Other Studies Reviewed:    The following studies were reviewed today:   EKG: None today  Recent Labs: 09/27/2020: NT-Pro BNP 736 10/14/2020: Hemoglobin 12.9; Platelets 313; TSH 2.587 11/23/2020: BNP 33.7; BUN 31; Creatinine, Ser 1.25; Magnesium 1.7; Potassium 4.2; Sodium 143  Recent Lipid Panel No results found for: CHOL, TRIG, HDL, CHOLHDL, VLDL, LDLCALC, LDLDIRECT  Physical Exam:    VS:  BP (!) 142/88    Pulse 80    Ht 5' (1.524 m)    Wt 232 lb (105.2 kg)    SpO2 97%    BMI 45.31 kg/m     Wt Readings from Last 3 Encounters:  12/08/20 232 lb (105.2 kg)  11/23/20 233 lb 9.6 oz (106 kg)  10/14/20 234 lb (106.1 kg)     GEN: Well nourished, well developed in no acute distress HEENT:  Normal NECK: No JVD; No carotid bruits LYMPHATICS: No lymphadenopathy CARDIAC: S1S2 noted,RRR, no murmurs, rubs, gallops RESPIRATORY:  Clear to auscultation without rales, wheezing or rhonchi  ABDOMEN: Soft, non-tender, non-distended, +bowel sounds, no guarding. EXTREMITIES: No edema, No cyanosis, no clubbing MUSCULOSKELETAL:  No deformity  SKIN: Warm and dry NEUROLOGIC:  Alert and oriented x 3, non-focal PSYCHIATRIC:  Normal affect, good insight  ASSESSMENT:    1. Medication management   2. Second degree AV block   3. Essential hypertension   4. S/P placement of cardiac pacemaker, medtronic 10/14/20   5. Chronic diastolic heart failure (HCC)    PLAN:     I am going to increase her Lasix to 40 mg twice a day for the next 5 days hopefully this will take away some of the fluid.  Her blood pressure slightly elevated but with the increase in Lasix I will wait for adding an additional antihypertensive.  Blood work will be done today.  The patient understands the need to lose weight with diet and exercise. We have discussed specific strategies for this.  The patient is in agreement with the above plan. The patient left the office in stable condition.  The patient will follow up in 3 months or sooner if needed.   Medication Adjustments/Labs and Tests Ordered: Current medicines are reviewed at length with the patient today.  Concerns regarding medicines are outlined above.  Orders Placed This Encounter  Procedures   Basic metabolic panel   Magnesium   Meds ordered this encounter  Medications   furosemide (LASIX) 40 MG tablet    Sig: Take 40 mg twice daily for 5 days then 40 mg daily    Dispense:  60 tablet    Refill:  2    Patient Instructions  Medication Instructions:  Your physician has recommended you make the following change in your medication:   Start: Lasix 40 mg twice daily for 5 days then 40 mg daily  *If you need a refill on your cardiac medications before your  next appointment, please call your pharmacy*   Lab Work: Your physician recommends that you return for lab work today: bmp, mg  If you have labs (blood work) drawn today and your tests are completely normal, you will receive your results only by:  MyChart Message (if you have MyChart) OR  A paper copy in the mail If you have any lab test that is abnormal or we need to change your treatment, we will call you to review the results.   Testing/Procedures: None   Follow-Up: At [facility], you and your health needs are our priority.  As part of our continuing mission to provide you with exceptional heart care, we have created designated Provider Care Teams.  These Care Teams include your primary Cardiologist (physician) and Advanced Practice Providers (APPs -  Physician Assistants and Nurse Practitioners) who all work together to provide you with the care you need, when you need it.  We recommend signing up for the patient portal called "MyChart".  Sign up information is provided on this After Visit Summary.  MyChart is used to connect with patients for Virtual Visits (Telemedicine).  Patients are able to view lab/test results, encounter notes, upcoming appointments, etc.  Non-urgent messages can be sent to your provider as well.   To learn more about what you can do with MyChart, go to [email or web address].    Your next appointment:   3 month(s)  The format for your next appointment:   In Person  Provider:   Thomasene RippleKardie Jerrion Tabbert, DO   Other Instructions       Adopting a Healthy Lifestyle.  Know what a healthy weight is for you (roughly BMI <25) and aim to maintain this   Aim for 7+ servings of fruits and vegetables daily   65-80+ fluid ounces of water or unsweet tea for healthy kidneys   Limit to max 1 drink of alcohol per day; avoid smoking/tobacco   Limit animal fats in diet for cholesterol and heart health - choose grass fed whenever available   Avoid highly processed  foods, and foods high in saturated/trans fats   Aim for low stress - take time to unwind and care for your mental health   Aim for 150 min of moderate intensity exercise weekly for heart health, and weights twice weekly for bone health   Aim for 7-9 hours of sleep daily   When it comes  to diets, agreement about the perfect plan isnt easy to find, even among the experts. Experts at the [facility] [employer] developed an idea known as the Healthy Eating Plate. Just imagine a plate divided into logical, healthy portions.   The emphasis is on diet quality:   Load up on vegetables and fruits - one-half of your plate: Aim for color and variety, and remember that potatoes dont count.   Go for whole grains - one-quarter of your plate: Whole wheat, barley, wheat berries, quinoa, oats, brown rice, and foods made with them. If you want pasta, go with whole wheat pasta.   Protein power - one-quarter of your plate: Fish, chicken, beans, and nuts are all healthy, versatile protein sources. Limit red meat.   The diet, however, does go beyond the plate, offering a few other suggestions.   Use healthy plant oils, such as olive, canola, soy, corn, sunflower and peanut. Check the labels, and avoid partially hydrogenated oil, which have unhealthy trans fats.   If youre thirsty, drink water. Coffee and tea are good in moderation, but skip sugary drinks and limit milk and dairy products to one or two daily servings.   The type of carbohydrate in the diet is more important than the amount. Some sources of carbohydrates, such as vegetables, fruits, whole grains, and beans-are healthier than others.   Finally, stay active  Signed, Thomasene Ripple, DO  12/08/2020 10:10 AM    [facility]

## 2020-12-09 LAB — MAGNESIUM: Magnesium: 1.7 mg/dL (ref 1.6–2.3)

## 2020-12-09 LAB — BASIC METABOLIC PANEL
BUN/Creatinine Ratio: 25 (ref 12–28)
BUN: 34 mg/dL — ABNORMAL HIGH (ref 8–27)
CO2: 23 mmol/L (ref 20–29)
Calcium: 9.9 mg/dL (ref 8.7–10.3)
Chloride: 102 mmol/L (ref 96–106)
Creatinine, Ser: 1.35 mg/dL — ABNORMAL HIGH (ref 0.57–1.00)
GFR calc Af Amer: 49 mL/min/{1.73_m2} — ABNORMAL LOW (ref >59)
GFR calc non Af Amer: 43 mL/min/{1.73_m2} — ABNORMAL LOW (ref >59)
Glucose: 95 mg/dL (ref 65–99)
Potassium: 4.2 mmol/L (ref 3.5–5.2)
Sodium: 142 mmol/L (ref 134–144)

## 2021-01-18 ENCOUNTER — Ambulatory Visit (INDEPENDENT_AMBULATORY_CARE_PROVIDER_SITE_OTHER): Payer: Commercial Managed Care - PPO

## 2021-01-18 DIAGNOSIS — I441 Atrioventricular block, second degree: Secondary | ICD-10-CM | POA: Diagnosis not present

## 2021-01-18 LAB — CUP PACEART REMOTE DEVICE CHECK
Battery Remaining Longevity: 146 mo
Battery Voltage: 3.18 V
Brady Statistic AP VP Percent: 6.39 %
Brady Statistic AP VS Percent: 0.02 %
Brady Statistic AS VP Percent: 90.44 %
Brady Statistic AS VS Percent: 3.14 %
Brady Statistic RA Percent Paced: 6.43 %
Brady Statistic RV Percent Paced: 96.84 %
Date Time Interrogation Session: 20220202014659
Implantable Lead Implant Date: 20211029
Implantable Lead Implant Date: 20211029
Implantable Lead Location: 753859
Implantable Lead Location: 753860
Implantable Lead Model: 5076
Implantable Lead Model: 5076
Implantable Pulse Generator Implant Date: 20211029
Lead Channel Impedance Value: 304 Ohm
Lead Channel Impedance Value: 361 Ohm
Lead Channel Impedance Value: 380 Ohm
Lead Channel Impedance Value: 456 Ohm
Lead Channel Pacing Threshold Amplitude: 0.625 V
Lead Channel Pacing Threshold Amplitude: 0.75 V
Lead Channel Pacing Threshold Pulse Width: 0.4 ms
Lead Channel Pacing Threshold Pulse Width: 0.4 ms
Lead Channel Sensing Intrinsic Amplitude: 10.25 mV
Lead Channel Sensing Intrinsic Amplitude: 10.25 mV
Lead Channel Sensing Intrinsic Amplitude: 2.625 mV
Lead Channel Sensing Intrinsic Amplitude: 2.625 mV
Lead Channel Setting Pacing Amplitude: 1.5 V
Lead Channel Setting Pacing Amplitude: 2 V
Lead Channel Setting Pacing Pulse Width: 0.4 ms
Lead Channel Setting Sensing Sensitivity: 1.2 mV

## 2021-01-23 ENCOUNTER — Encounter: Payer: Self-pay | Admitting: *Deleted

## 2021-01-23 ENCOUNTER — Encounter: Payer: Self-pay | Admitting: Cardiology

## 2021-01-23 ENCOUNTER — Other Ambulatory Visit: Payer: Self-pay

## 2021-01-23 ENCOUNTER — Telehealth: Payer: Self-pay | Admitting: *Deleted

## 2021-01-23 ENCOUNTER — Ambulatory Visit (INDEPENDENT_AMBULATORY_CARE_PROVIDER_SITE_OTHER): Payer: Commercial Managed Care - PPO | Admitting: Cardiology

## 2021-01-23 VITALS — BP 164/88 | HR 86 | Ht 60.0 in | Wt 234.2 lb

## 2021-01-23 DIAGNOSIS — R0681 Apnea, not elsewhere classified: Secondary | ICD-10-CM

## 2021-01-23 DIAGNOSIS — R400 Somnolence: Secondary | ICD-10-CM

## 2021-01-23 DIAGNOSIS — Z950 Presence of cardiac pacemaker: Secondary | ICD-10-CM

## 2021-01-23 DIAGNOSIS — R0683 Snoring: Secondary | ICD-10-CM | POA: Diagnosis not present

## 2021-01-23 DIAGNOSIS — I442 Atrioventricular block, complete: Secondary | ICD-10-CM

## 2021-01-23 NOTE — Progress Notes (Signed)
Remote pacemaker transmission.   

## 2021-01-23 NOTE — Telephone Encounter (Signed)
Staff message sent to Coralee North ok to schedule sleep study. Per [employer] no PA is required. Call reference # [id number].

## 2021-01-23 NOTE — Progress Notes (Signed)
Electrophysiology Office Note   Date:  01/23/2021   ID:  Cynthia Archer, DOB Sep 10, 1961, MRN [id number]  PCP:  Dema Severin, NP  Cardiologist:  Servando Salina Primary Electrophysiologist:  Cynthia Udovich Cynthia Loa, MD    Chief Complaint: pacemaker   History of Present Illness: Cynthia Archer is a 60 y.o. female who is being seen today for the evaluation of pacemaker at the request of York, Regina F, NP. Presenting today for electrophysiology evaluation.  She has a history significant for hypertension, hypothyroidism, obesity, and previously Mobitz 1 heart block who was seen in the hospital October 2021 with advanced second-degree AV block.  She is now status post Medtronic dual-chamber pacemaker implanted 10/14/2020.  Today, she denies symptoms of palpitations, chest pain, shortness of breath, orthopnea, PND, lower extremity edema, claudication, dizziness, presyncope, syncope, bleeding, or neurologic sequela. The patient is tolerating medications without difficulties.  She overall feels well.  She has no chest pain or shortness of breath.  She does complain of daily fatigue, morning headaches, lower extremity edema.  She also states that her blood pressure has been somewhat difficult to control.  She says that she snores at night and feels that she wakes up multiple times in the night.  She has never been told that she holds her breath when she sleeps, though no one is in the room with her currently.   Past Medical History:  Diagnosis Date  . CHB (complete heart block) (HCC) 10/15/2020  . Degeneration of lumbar intervertebral disc 02/08/2020  . Essential hypertension 02/26/2017  . Hypercalcemia 02/26/2017  . Hyperparathyroidism (HCC) 02/26/2017  . S/P placement of cardiac pacemaker, medtronic 10/14/20 10/15/2020  . Second degree AV block 10/14/2020  . Symptomatic bradycardia 10/15/2020  . Thyroid disease   . Vitamin D deficiency 02/26/2017   Past Surgical History:  Procedure Laterality Date  .  ANKLE SURGERY    . PACEMAKER IMPLANT N/A 10/14/2020   Procedure: PACEMAKER IMPLANT;  Surgeon: Regan Lemming, MD;  Location: [facility];  Service: Cardiovascular;  Laterality: N/A;  . THYROID SURGERY    . TUBAL LIGATION       Current Outpatient Medications  Medication Sig Dispense Refill  . acetaminophen (TYLENOL) 650 MG CR tablet Take 650 mg by mouth every 8 (eight) hours as needed for pain.    . Cholecalciferol (VITAMIN D3) 1.25 MG (50000 UT) CAPS Take 50,000 Units by mouth every Sunday.    . furosemide (LASIX) 40 MG tablet Take 40 mg by mouth daily.    Marland Kitchen levothyroxine (SYNTHROID) 50 MCG tablet Take 50 mcg by mouth daily at 6 (six) AM.     . naproxen sodium (ALEVE) 220 MG tablet Take 440 mg by mouth 2 (two) times daily with a meal.    . omeprazole (PRILOSEC) 40 MG capsule Take 40 mg by mouth daily at 6 (six) AM.     . potassium chloride SA (KLOR-CON) 20 MEQ tablet Take one tablet daily. 30 tablet 3  . rosuvastatin (CRESTOR) 10 MG tablet Take 10 mg by mouth daily at 6 (six) AM.    . amLODipine (NORVASC) 10 MG tablet Take 1 tablet (10 mg total) by mouth daily. 30 tablet 3  . nitroGLYCERIN (NITROSTAT) 0.4 MG SL tablet Place 1 tablet (0.4 mg total) under the tongue every 5 (five) minutes as needed for chest pain. 25 tablet 11   No current facility-administered medications for this visit.    Allergies:   Patient has no known allergies.  Social History:  The patient  reports that she has never smoked. She has never used smokeless tobacco. She reports previous alcohol use. She reports that she does not use drugs.   Family History:  The patient's family history includes Alzheimer's disease in her maternal grandmother; COPD in her father; Heart disease in her mother; Kidney failure in her mother; Lung cancer in her father, maternal grandfather, and paternal grandfather.    ROS:  Please see the history of present illness.   Otherwise, review of systems is positive for none.    All other systems are reviewed and negative.    PHYSICAL EXAM: VS:  BP (!) 164/88   Pulse 86   Ht 5' (1.524 m)   Wt 234 lb 3.2 oz (106.2 kg)   SpO2 93%   BMI 45.74 kg/m  , BMI Body mass index is 45.74 kg/m. GEN: Well nourished, well developed, in no acute distress  HEENT: normal  Neck: no JVD, carotid bruits, or masses Cardiac: RRR; no murmurs, rubs, or gallops,no edema  Respiratory:  clear to auscultation bilaterally, normal work of breathing GI: soft, nontender, nondistended, + BS MS: no deformity or atrophy  Skin: warm and dry, device pocket is well healed Neuro:  Strength and sensation are intact Psych: euthymic mood, full affect  EKG:  EKG is ordered today. Personal review of the ekg ordered shows atrial sensed, ventricular paced, PACs  Device interrogation is reviewed today in detail.  See PaceArt for details.   Recent Labs: 09/27/2020: NT-Pro BNP 736 10/14/2020: Hemoglobin 12.9; Platelets 313; TSH 2.587 11/23/2020: BNP 33.7 12/08/2020: BUN 34; Creatinine, Ser 1.35; Magnesium 1.7; Potassium 4.2; Sodium 142    Lipid Panel  No results found for: CHOL, TRIG, HDL, CHOLHDL, VLDL, LDLCALC, LDLDIRECT   Wt Readings from Last 3 Encounters:  01/23/21 234 lb 3.2 oz (106.2 kg)  12/08/20 232 lb (105.2 kg)  11/23/20 233 lb 9.6 oz (106 kg)      Other studies Reviewed: Additional studies/ records that were reviewed today include: TTE 09/27/20  Review of the above records today demonstrates:  EF 60 to 65% Grade 1 diastolic dysfunction Right ventricle normal size and function Left atrium normal in size Trace to mild mitral regurgitation   ASSESSMENT AND PLAN:  1.  Second-degree AV block: Status post Medtronic dual-chamber pacemaker implanted 10/14/2020.  Device functioning appropriately.  No changes.  2.  Hypertension: Blood pressure significantly elevated today.  It is lower at home.  Potentially caused by sleep apnea.  Cynthia Archer order a sleep study.  3.  Chronic  diastolic heart failure: Mild lower extremity edema.  Continue Lasix.  4.  Snoring, daytime fatigue, morning headaches, hypertension: All could be due to sleep apnea.  Cynthia Archer order a sleep study.  Case discussed with primary cardiology  Current medicines are reviewed at length with the patient today.   The patient does not have concerns regarding her medicines.  The following changes were made today:  none  Labs/ tests ordered today include:  Orders Placed This Encounter  Procedures  . EKG 12-Lead  . Split night study     Disposition:   FU with Aleiyah Halpin 9 months  Signed, Orma Cheetham Cynthia Loa, MD  01/23/2021 9:29 AM     [facility] [address] [place] [address] [phone] (office) [phone] (fax)

## 2021-01-23 NOTE — Patient Instructions (Addendum)
Medication Instructions:  Your physician recommends that you continue on your current medications as directed. Please refer to the Current Medication list given to you today.  *If you need a refill on your cardiac medications before your next appointment, please call your pharmacy*   Lab Work: None ordered   Testing/Procedures: Your physician has recommended that you have a sleep study. This test records several body functions during sleep, including: brain activity, eye movement, oxygen and carbon dioxide blood levels, heart rate and rhythm, breathing rate and rhythm, the flow of air through your mouth and nose, snoring, body muscle movements, and chest and belly movement.  The office will call you to arrange this once approved by insurance   Follow-Up: At [facility], you and your health needs are our priority.  As part of our continuing mission to provide you with exceptional heart care, we have created designated Provider Care Teams.  These Care Teams include your primary Cardiologist (physician) and Advanced Practice Providers (APPs -  Physician Assistants and Nurse Practitioners) who all work together to provide you with the care you need, when you need it.  Remote monitoring is used to monitor your Pacemaker or ICD from home. This monitoring reduces the number of office visits required to check your device to one time per year. It allows [place] to keep an eye on the functioning of your device to ensure it is working properly. You are scheduled for a device check from home on 04/19/2021. You may send your transmission at any time that day. If you have a wireless device, the transmission will be sent automatically. After your physician reviews your transmission, you will receive a postcard with your next transmission date.  Your next appointment:   9 month(s)  The format for your next appointment:   In Person  Provider:   Loman Brooklyn, MD   Thank you for choosing [facility]!!   Dory Horn, RN [phone]    Other Instructions

## 2021-01-28 ENCOUNTER — Other Ambulatory Visit: Payer: Self-pay | Admitting: Cardiology

## 2021-02-01 ENCOUNTER — Telehealth: Payer: Self-pay | Admitting: *Deleted

## 2021-02-01 NOTE — Telephone Encounter (Signed)
Patient is scheduled for lab study on 03/17/21. Patient understands her sleep study will be done at [facility] sleep lab. Patient understands she will receive a sleep packet in a week or so. Patient understands to call if she does not receive the sleep packet in a timely manner.  Left detailed message on voicemail with date and time of titration and informed patient to call back to confirm or reschedule.

## 2021-02-01 NOTE — Telephone Encounter (Signed)
-----   Message from Gaynelle Cage, CMA sent at 01/23/2021 11:58 AM EST ----- Regarding: RE: OSA Ok to call and schedule sleep study. Per UHC/UMR no PA is required. ----- Message ----- From: Baird Lyons, RN Sent: 01/23/2021  10:20 AM EST To: C[facility] Sleep Studies Subject: OSA                                            Sleep study order placed for this pt. Daytime somnolence, apnea, snoring

## 2021-03-06 ENCOUNTER — Other Ambulatory Visit: Payer: Self-pay | Admitting: Cardiology

## 2021-03-14 ENCOUNTER — Other Ambulatory Visit: Payer: Self-pay

## 2021-03-14 ENCOUNTER — Encounter: Payer: Self-pay | Admitting: Cardiology

## 2021-03-14 ENCOUNTER — Ambulatory Visit: Payer: Commercial Managed Care - PPO | Admitting: Cardiology

## 2021-03-14 VITALS — BP 152/78 | HR 80 | Ht 60.0 in | Wt 233.0 lb

## 2021-03-14 DIAGNOSIS — I441 Atrioventricular block, second degree: Secondary | ICD-10-CM | POA: Diagnosis not present

## 2021-03-14 DIAGNOSIS — I442 Atrioventricular block, complete: Secondary | ICD-10-CM | POA: Diagnosis not present

## 2021-03-14 DIAGNOSIS — I10 Essential (primary) hypertension: Secondary | ICD-10-CM | POA: Diagnosis not present

## 2021-03-14 DIAGNOSIS — R5383 Other fatigue: Secondary | ICD-10-CM | POA: Insufficient documentation

## 2021-03-14 MED ORDER — HYDRALAZINE HCL 25 MG PO TABS: 25.0000 mg | ORAL_TABLET | Freq: Two times a day (BID) | ORAL | 3 refills | Status: DC

## 2021-03-14 NOTE — Progress Notes (Signed)
Cardiology Office Note:    Date:  03/14/2021   ID:  Cynthia Archer, DOB [birth year], MRN [id number]  PCP:  Dema Severin, NP  Cardiologist:  Thomasene Ripple, DO  Electrophysiologist:  Regan Lemming, MD   Referring MD: Dema Severin, NP   The shortness of breath is improving but I am still tired  History of Present Illness:    Cynthia Archer is a 61 y.o. 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  Did see the patient on November 23, 2020 at that time she has some significant leg edema I increase her Lasix to 40 mg daily for 2 weeks and then back to 20 mg daily. At that time she had not had her coronary CTA as she tells me she was planning on doing so.  In the interim she did see Dr. Elberta Fortis and reported that she was experiencing some fatigue she was then referred for a sleep study.  The patient tells me she has not gotten her sleep study and would prefer not to get this testing now due to the fact that she feels she is backed up with medical bills.  In addition she also has declined the coronary CTA.  And wishes not to get any further testing for now.  Past Medical History:  Diagnosis Date  . CHB (complete heart block) (HCC) 10/15/2020  . Degeneration of lumbar intervertebral disc 02/08/2020  . Essential hypertension 02/26/2017  . Hypercalcemia 02/26/2017  . Hyperparathyroidism (HCC) 02/26/2017  . S/P placement of cardiac pacemaker, medtronic 10/14/20 10/15/2020  . Second degree AV block 10/14/2020  . Symptomatic bradycardia 10/15/2020  . Thyroid disease   . Vitamin D deficiency 02/26/2017    Past Surgical History:  Procedure Laterality Date  . ANKLE SURGERY    . PACEMAKER IMPLANT N/A 10/14/2020   Procedure: PACEMAKER IMPLANT;  Surgeon: Regan Lemming, MD;  Location: [facility];  Service: Cardiovascular;  Laterality: N/A;  .  THYROID SURGERY    . TUBAL LIGATION      Current Medications: Current Meds  Medication Sig  . acetaminophen (TYLENOL) 650 MG CR tablet Take 650 mg by mouth every 8 (eight) hours as needed for pain.  Marland Kitchen amLODipine (NORVASC) 10 MG tablet TAKE 1 TABLET BY MOUTH EVERY DAY  . furosemide (LASIX) 40 MG tablet Take 1 tablet (40 mg total) by mouth daily.  . hydrALAZINE (APRESOLINE) 25 MG tablet Take 1 tablet (25 mg total) by mouth in the morning and at bedtime.  Marland Kitchen levothyroxine (SYNTHROID) 50 MCG tablet Take 50 mcg by mouth daily at 6 (six) AM.   . naproxen sodium (ALEVE) 220 MG tablet Take 440 mg by mouth 2 (two) times daily with a meal.  . omeprazole (PRILOSEC) 40 MG capsule Take 40 mg by mouth daily at 6 (six) AM.   . potassium chloride SA (KLOR-CON) 20 MEQ tablet Take one tablet daily.  . rosuvastatin (CRESTOR) 10 MG tablet Take 10 mg by mouth daily at 6 (six) AM.     Allergies:   Patient has no known allergies.   Social History   Socioeconomic History  . Marital status: Married    Spouse name: Not on file  . Number of children: Not on file  . Years of education: Not on file  . Highest education level: Not on file  Occupational History  . Not  on file  Tobacco Use  . Smoking status: Never Smoker  . Smokeless tobacco: Never Used  Substance and Sexual Activity  . Alcohol use: Not Currently  . Drug use: Never  . Sexual activity: Not on file  Other Topics Concern  . Not on file  Social History Narrative  . Not on file   Social Determinants of Health   Financial Resource Strain: Not on file  Food Insecurity: Not on file  Transportation Needs: Not on file  Physical Activity: Not on file  Stress: Not on file  Social Connections: Not on file     Family History: The patient's family history includes Alzheimer's disease in her maternal grandmother; COPD in her father; Heart disease in her mother; Kidney failure in her mother; Lung cancer in her father, maternal grandfather, and  paternal grandfather.  ROS:   Review of Systems  Constitution: Negative for decreased appetite, fever and weight gain.  HENT: Negative for congestion, ear discharge, hoarse voice and sore throat.   Eyes: Negative for discharge, redness, vision loss in right eye and visual halos.  Cardiovascular: Negative for chest pain, dyspnea on exertion, leg swelling, orthopnea and palpitations.  Respiratory: Negative for cough, hemoptysis, shortness of breath and snoring.   Endocrine: Negative for heat intolerance and polyphagia.  Hematologic/Lymphatic: Negative for bleeding problem. Does not bruise/bleed easily.  Skin: Negative for flushing, nail changes, rash and suspicious lesions.  Musculoskeletal: Negative for arthritis, joint pain, muscle cramps, myalgias, neck pain and stiffness.  Gastrointestinal: Negative for abdominal pain, bowel incontinence, diarrhea and excessive appetite.  Genitourinary: Negative for decreased libido, genital sores and incomplete emptying.  Neurological: Negative for brief paralysis, focal weakness, headaches and loss of balance.  Psychiatric/Behavioral: Negative for altered mental status, depression and suicidal ideas.  Allergic/Immunologic: Negative for HIV exposure and persistent infections.    EKGs/Labs/Other Studies Reviewed:    The following studies were reviewed today:   EKG: None today  Zio monitor  September 27, 2020 Patient was monitored from 09/27/2020 to 10/04/2020. Indication:                    Dizziness and giddiness Ordering physician:  Garwin Brothersajan R Revankar, MD  Referring physician:        Garwin Brothersajan R Revankar, MD   Baseline rhythm: Sinus  Minimum heart rate: 29 BPM.  Average heart rate: 63 BPM.  Maximal heart rate 218 BPM.  Atrial arrhythmia: PACs were seen.  Atrial runs were also noted the longest was 21 seconds and the fastest was 13 beats at a rate of 214.  Also there was activity suggestive of atrial tachycardia with variable block  Ventricular  arrhythmia: 2 episodes of ventricular tachycardia the longest lasting 10 beats.  Average heart rate 123.  Ventricular bigeminy and trigeminy was also noted  Conduction abnormality: Significant conduction abnormalities were noted especially in the light of indication for this testing.  They included atrial tachycardia with variable block, AV block of the second and third degree were noted.  Symptoms: As mentioned above dizziness and giddiness   Conclusion:  Markedly abnormal event monitor with episodes of atrial and ventricular tachycardia as noted above.  More significant is the high-grade AV block especially in the setting of significant symptoms for which the monitoring was done.  The referring physician Dr Servando Salinaobb is already aware of the findings and has made arrangements for the patient to be notified.  I have also discussed this report with Dr Servando Salinaobb.  She has contacted our electrophysiology  colleagues and and made them aware.    Recent Labs: 09/27/2020: NT-Pro BNP 736 10/14/2020: Hemoglobin 12.9; Platelets 313; TSH 2.587 11/23/2020: BNP 33.7 12/08/2020: BUN 34; Creatinine, Ser 1.35; Magnesium 1.7; Potassium 4.2; Sodium 142  Recent Lipid Panel No results found for: CHOL, TRIG, HDL, CHOLHDL, VLDL, LDLCALC, LDLDIRECT  Physical Exam:    VS:  BP (!) 152/78   Pulse 80   Ht 5' (1.524 m)   Wt 233 lb (105.7 kg)   SpO2 95%   BMI 45.50 kg/m     Wt Readings from Last 3 Encounters:  03/14/21 233 lb (105.7 kg)  01/23/21 234 lb 3.2 oz (106.2 kg)  12/08/20 232 lb (105.2 kg)     GEN: Well nourished, well developed in no acute distress HEENT: Normal NECK: No JVD; No carotid bruits LYMPHATICS: No lymphadenopathy CARDIAC: S1S2 noted,RRR, no murmurs, rubs, gallops RESPIRATORY:  Clear to auscultation without rales, wheezing or rhonchi  ABDOMEN: Soft, non-tender, non-distended, +bowel sounds, no guarding. EXTREMITIES: No edema, No cyanosis, no clubbing MUSCULOSKELETAL:  No deformity   SKIN: Warm and dry NEUROLOGIC:  Alert and oriented x 3, non-focal PSYCHIATRIC:  Normal affect, good insight  ASSESSMENT:    1. Essential hypertension   2. Second degree AV block   3. CHB (complete heart block) (HCC)   4. Morbid obesity (HCC)   5. Other fatigue    PLAN:    She is hypertensive in the office today.  She had gone back to taking Lasix 20 mg daily she has some leg edema.  She prefers not to increase Lasix dosing.  Her goal for hypertension should be less than 130/80.  I will add hydralazine 25 mg to her regimen.  Talked about her diagnostic testing are pending.  The patient prefers not to get this testing done at this time as she has 2 reasons 1 financial concerns with payment and second she does not like to drive to [place].  I have offered to have the patient talk to our financial assistance program and refer her to our social worker colleagues but the patient has declined this.  The patient is in agreement with the above plan. The patient left the office in stable condition.  The patient will follow up in 3 months or sooner as needed   Medication Adjustments/Labs and Tests Ordered: Current medicines are reviewed at length with the patient today.  Concerns regarding medicines are outlined above.  No orders of the defined types were placed in this encounter.  Meds ordered this encounter  Medications  . hydrALAZINE (APRESOLINE) 25 MG tablet    Sig: Take 1 tablet (25 mg total) by mouth in the morning and at bedtime.    Dispense:  180 tablet    Refill:  3    Patient Instructions  Medication Instructions:  Your physician has recommended you make the following change in your medication:  START: Hydralazine 25 mg take one tablet by mouth twice daily.  *If you need a refill on your cardiac medications before your next appointment, please call your pharmacy*   Lab Work: None If you have labs (blood work) drawn today and your tests are completely normal, you will  receive your results only by: Marland Kitchen MyChart Message (if you have MyChart) OR . A paper copy in the mail If you have any lab test that is abnormal or we need to change your treatment, we will call you to review the results.   Testing/Procedures: None   Follow-Up: At [facility],  you and your health needs are our priority.  As part of our continuing mission to provide you with exceptional heart care, we have created designated Provider Care Teams.  These Care Teams include your primary Cardiologist (physician) and Advanced Practice Providers (APPs -  Physician Assistants and Nurse Practitioners) who all work together to provide you with the care you need, when you need it.  We recommend signing up for the patient portal called "MyChart".  Sign up information is provided on this After Visit Summary.  MyChart is used to connect with patients for Virtual Visits (Telemedicine).  Patients are able to view lab/test results, encounter notes, upcoming appointments, etc.  Non-urgent messages can be sent to your provider as well.   To learn more about what you can do with MyChart, go to [email or web address].    Your next appointment:   3 month(s)  The format for your next appointment:   In Person  Provider:   Thomasene Ripple, DO  Other Instructions      Adopting a Healthy Lifestyle.  Know what a healthy weight is for you (roughly BMI <25) and aim to maintain this   Aim for 7+ servings of fruits and vegetables daily   65-80+ fluid ounces of water or unsweet tea for healthy kidneys   Limit to max 1 drink of alcohol per day; avoid smoking/tobacco   Limit animal fats in diet for cholesterol and heart health - choose grass fed whenever available   Avoid highly processed foods, and foods high in saturated/trans fats   Aim for low stress - take time to unwind and care for your mental health   Aim for 150 min of moderate intensity exercise weekly for heart health, and weights twice weekly for  bone health   Aim for 7-9 hours of sleep daily   When it comes to diets, agreement about the perfect plan isnt easy to find, even among the experts. Experts at the [facility] [employer] developed an idea known as the Healthy Eating Plate. Just imagine a plate divided into logical, healthy portions.   The emphasis is on diet quality:   Load up on vegetables and fruits - one-half of your plate: Aim for color and variety, and remember that potatoes dont count.   Go for whole grains - one-quarter of your plate: Whole wheat, barley, wheat berries, quinoa, oats, brown rice, and foods made with them. If you want pasta, go with whole wheat pasta.   Protein power - one-quarter of your plate: Fish, chicken, beans, and nuts are all healthy, versatile protein sources. Limit red meat.   The diet, however, does go beyond the plate, offering a few other suggestions.   Use healthy plant oils, such as olive, canola, soy, corn, sunflower and peanut. Check the labels, and avoid partially hydrogenated oil, which have unhealthy trans fats.   If youre thirsty, drink water. Coffee and tea are good in moderation, but skip sugary drinks and limit milk and dairy products to one or two daily servings.   The type of carbohydrate in the diet is more important than the amount. Some sources of carbohydrates, such as vegetables, fruits, whole grains, and beans-are healthier than others.   Finally, stay active  Signed, Thomasene Ripple, DO  03/14/2021 4:15 PM    [facility]

## 2021-03-14 NOTE — Patient Instructions (Addendum)
Medication Instructions:  Your physician has recommended you make the following change in your medication:  START: Hydralazine 25 mg take one tablet by mouth twice daily.  *If you need a refill on your cardiac medications before your next appointment, please call your pharmacy*   Lab Work: None If you have labs (blood work) drawn today and your tests are completely normal, you will receive your results only by: Marland Kitchen MyChart Message (if you have MyChart) OR . A paper copy in the mail If you have any lab test that is abnormal or we need to change your treatment, we will call you to review the results.   Testing/Procedures: None   Follow-Up: At [facility], you and your health needs are our priority.  As part of our continuing mission to provide you with exceptional heart care, we have created designated Provider Care Teams.  These Care Teams include your primary Cardiologist (physician) and Advanced Practice Providers (APPs -  Physician Assistants and Nurse Practitioners) who all work together to provide you with the care you need, when you need it.  We recommend signing up for the patient portal called "MyChart".  Sign up information is provided on this After Visit Summary.  MyChart is used to connect with patients for Virtual Visits (Telemedicine).  Patients are able to view lab/test results, encounter notes, upcoming appointments, etc.  Non-urgent messages can be sent to your provider as well.   To learn more about what you can do with MyChart, go to [email or web address].    Your next appointment:   3 month(s)  The format for your next appointment:   In Person  Provider:   Thomasene Ripple, DO  Other Instructions

## 2021-03-17 ENCOUNTER — Encounter (HOSPITAL_BASED_OUTPATIENT_CLINIC_OR_DEPARTMENT_OTHER): Payer: Commercial Managed Care - PPO | Admitting: Cardiology

## 2021-04-07 ENCOUNTER — Other Ambulatory Visit: Payer: Self-pay

## 2021-04-07 MED ORDER — POTASSIUM CHLORIDE CRYS ER 20 MEQ PO TBCR: EXTENDED_RELEASE_TABLET | ORAL | 5 refills | Status: DC

## 2021-04-07 NOTE — Progress Notes (Signed)
Refill sent to pharmacy.   

## 2021-04-19 ENCOUNTER — Ambulatory Visit (INDEPENDENT_AMBULATORY_CARE_PROVIDER_SITE_OTHER): Payer: Commercial Managed Care - PPO

## 2021-04-19 DIAGNOSIS — I442 Atrioventricular block, complete: Secondary | ICD-10-CM

## 2021-04-20 LAB — CUP PACEART REMOTE DEVICE CHECK
Battery Remaining Longevity: 143 mo
Battery Voltage: 3.14 V
Brady Statistic AP VP Percent: 8.97 %
Brady Statistic AP VS Percent: 0.03 %
Brady Statistic AS VP Percent: 87.33 %
Brady Statistic AS VS Percent: 3.67 %
Brady Statistic RA Percent Paced: 8.93 %
Brady Statistic RV Percent Paced: 96.3 %
Date Time Interrogation Session: 20220503195216
Implantable Lead Implant Date: 20211029
Implantable Lead Implant Date: 20211029
Implantable Lead Location: 753859
Implantable Lead Location: 753860
Implantable Lead Model: 5076
Implantable Lead Model: 5076
Implantable Pulse Generator Implant Date: 20211029
Lead Channel Impedance Value: 285 Ohm
Lead Channel Impedance Value: 361 Ohm
Lead Channel Impedance Value: 361 Ohm
Lead Channel Impedance Value: 456 Ohm
Lead Channel Pacing Threshold Amplitude: 0.5 V
Lead Channel Pacing Threshold Amplitude: 0.75 V
Lead Channel Pacing Threshold Pulse Width: 0.4 ms
Lead Channel Pacing Threshold Pulse Width: 0.4 ms
Lead Channel Sensing Intrinsic Amplitude: 3.625 mV
Lead Channel Sensing Intrinsic Amplitude: 3.625 mV
Lead Channel Sensing Intrinsic Amplitude: 8.375 mV
Lead Channel Sensing Intrinsic Amplitude: 8.375 mV
Lead Channel Setting Pacing Amplitude: 1.5 V
Lead Channel Setting Pacing Amplitude: 2 V
Lead Channel Setting Pacing Pulse Width: 0.4 ms
Lead Channel Setting Sensing Sensitivity: 1.2 mV

## 2021-05-09 NOTE — Progress Notes (Signed)
Remote pacemaker transmission.   

## 2021-06-16 ENCOUNTER — Encounter: Payer: Self-pay | Admitting: Cardiology

## 2021-06-16 ENCOUNTER — Other Ambulatory Visit: Payer: Self-pay

## 2021-06-16 ENCOUNTER — Ambulatory Visit: Payer: Commercial Managed Care - PPO | Admitting: Cardiology

## 2021-06-16 VITALS — BP 168/90 | HR 88 | Ht 60.0 in | Wt 237.0 lb

## 2021-06-16 DIAGNOSIS — I442 Atrioventricular block, complete: Secondary | ICD-10-CM | POA: Diagnosis not present

## 2021-06-16 DIAGNOSIS — Z950 Presence of cardiac pacemaker: Secondary | ICD-10-CM

## 2021-06-16 DIAGNOSIS — I10 Essential (primary) hypertension: Secondary | ICD-10-CM

## 2021-06-16 MED ORDER — HYDRALAZINE HCL 50 MG PO TABS: 50.0000 mg | ORAL_TABLET | Freq: Two times a day (BID) | ORAL | 3 refills | Status: DC

## 2021-06-16 NOTE — Patient Instructions (Addendum)
Medication Instructions:  Your physician has recommended you make the following change in your medication:  INCREASE: Hydralazine 50 mg twice daily  *If you need a refill on your cardiac medications before your next appointment, please call your pharmacy*   Lab Work: None If you have labs (blood work) drawn today and your tests are completely normal, you will receive your results only by: MyChart Message (if you have MyChart) OR A paper copy in the mail If you have any lab test that is abnormal or we need to change your treatment, we will call you to review the results.   Testing/Procedures: None   Follow-Up: At [facility], you and your health needs are our priority.  As part of our continuing mission to provide you with exceptional heart care, we have created designated Provider Care Teams.  These Care Teams include your primary Cardiologist (physician) and Advanced Practice Providers (APPs -  Physician Assistants and Nurse Practitioners) who all work together to provide you with the care you need, when you need it.  We recommend signing up for the patient portal called "MyChart".  Sign up information is provided on this After Visit Summary.  MyChart is used to connect with patients for Virtual Visits (Telemedicine).  Patients are able to view lab/test results, encounter notes, upcoming appointments, etc.  Non-urgent messages can be sent to your provider as well.   To learn more about what you can do with MyChart, go to [email or web address].    Your next appointment:   6 month(s)  The format for your next appointment:   In Person  Provider:   Belva Crome, MD   Other Instructions

## 2021-06-16 NOTE — Progress Notes (Signed)
Cardiology Office Note:    Date:  06/16/2021   ID:  Cynthia Archer, DOB December 08, 1961, MRN [id number]  PCP:  Dema Severin, NP  Cardiologist:  Thomasene Ripple, DO  Electrophysiologist:  Regan Lemming, MD   Referring MD: Dema Severin, NP   Chief Complaint  Patient presents with   Follow-up  I am doing okay  History of Present Illness:    Cynthia Archer is a 60 y.o. 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  Did see the patient on November 23, 2020 at that time she has some significant leg edema I increase her Lasix to 40 mg daily for 2 weeks and then back to 20 mg daily. At that time she had not had her coronary CTA as she tells me she was planning on doing so.   I saw the patient on March 14, 2020 at that time I added hydralazine 25 mg to her regimen.  We talked about her diagnostic testing at that time and she was willing to get this test since that she was concerned about financial burden.  I offered social work assistance with the patient to help with payment options and she had declined. She is here today for follow-up visit. Past Medical History:  Diagnosis Date   CHB (complete heart block) (HCC) 10/15/2020   Degeneration of lumbar intervertebral disc 02/08/2020   Essential hypertension 02/26/2017   Hypercalcemia 02/26/2017   Hyperparathyroidism (HCC) 02/26/2017   S/P placement of cardiac pacemaker, medtronic 10/14/20 10/15/2020   Second degree AV block 10/14/2020   Symptomatic bradycardia 10/15/2020   Thyroid disease    Vitamin D deficiency 02/26/2017    Past Surgical History:  Procedure Laterality Date   ANKLE SURGERY     PACEMAKER IMPLANT N/A 10/14/2020   Procedure: PACEMAKER IMPLANT;  Surgeon: Regan Lemming, MD;  [facility]: [facility];  Service: Cardiovascular;  Laterality: N/A;   THYROID SURGERY     TUBAL  LIGATION      Current Medications: Current Meds  Medication Sig   acetaminophen (TYLENOL) 650 MG CR tablet Take 650 mg by mouth every 8 (eight) hours as needed for pain.   amLODipine (NORVASC) 10 MG tablet TAKE 1 TABLET BY MOUTH EVERY DAY   furosemide (LASIX) 40 MG tablet Take 1 tablet (40 mg total) by mouth daily.   hydrALAZINE (APRESOLINE) 25 MG tablet Take 1 tablet (25 mg total) by mouth in the morning and at bedtime.   levothyroxine (SYNTHROID) 50 MCG tablet Take 50 mcg by mouth daily at 6 (six) AM.    naproxen sodium (ALEVE) 220 MG tablet Take 440 mg by mouth 2 (two) times daily with a meal.   nitroGLYCERIN (NITROSTAT) 0.4 MG SL tablet Place 1 tablet (0.4 mg total) under the tongue every 5 (five) minutes as needed for chest pain.   omeprazole (PRILOSEC) 40 MG capsule Take 40 mg by mouth daily at 6 (six) AM.    potassium chloride SA (KLOR-CON) 20 MEQ tablet Take 20 mEq by mouth daily.   rosuvastatin (CRESTOR) 10 MG tablet Take 10 mg by mouth daily at 6 (six) AM.     Allergies:   Patient has no known allergies.   Social History   Socioeconomic History   Marital status: Married    Spouse name: Not on file   Number of children: Not on file  Years of education: Not on file   Highest education level: Not on file  Occupational History   Not on file  Tobacco Use   Smoking status: Never   Smokeless tobacco: Never  Substance and Sexual Activity   Alcohol use: Not Currently   Drug use: Never   Sexual activity: Not on file  Other Topics Concern   Not on file  Social History Narrative   Not on file   Social Determinants of Health   Financial Resource Strain: Not on file  Food Insecurity: Not on file  Transportation Needs: Not on file  Physical Activity: Not on file  Stress: Not on file  Social Connections: Not on file     Family History: The patient's family history includes Alzheimer's disease in her maternal grandmother; COPD in her father; Heart disease in her mother;  Kidney failure in her mother; Lung cancer in her father, maternal grandfather, and paternal grandfather.  ROS:   Review of Systems  Constitution: Negative for decreased appetite, fever and weight gain.  HENT: Negative for congestion, ear discharge, hoarse voice and sore throat.   Eyes: Negative for discharge, redness, vision loss in right eye and visual halos.  Cardiovascular: Negative for chest pain, dyspnea on exertion, leg swelling, orthopnea and palpitations.  Respiratory: Negative for cough, hemoptysis, shortness of breath and snoring.   Endocrine: Negative for heat intolerance and polyphagia.  Hematologic/Lymphatic: Negative for bleeding problem. Does not bruise/bleed easily.  Skin: Negative for flushing, nail changes, rash and suspicious lesions.  Musculoskeletal: Negative for arthritis, joint pain, muscle cramps, myalgias, neck pain and stiffness.  Gastrointestinal: Negative for abdominal pain, bowel incontinence, diarrhea and excessive appetite.  Genitourinary: Negative for decreased libido, genital sores and incomplete emptying.  Neurological: Negative for brief paralysis, focal weakness, headaches and loss of balance.  Psychiatric/Behavioral: Negative for altered mental status, depression and suicidal ideas.  Allergic/Immunologic: Negative for HIV exposure and persistent infections.    EKGs/Labs/Other Studies Reviewed:    The following studies were reviewed today:   EKG: None today  Zio monitor  September 27, 2020 Patient was monitored from 09/27/2020 to 10/04/2020. Indication:                    Dizziness and giddiness Ordering physician:  Garwin Brothers, MD  Referring physician:        Garwin Brothers, MD     Baseline rhythm: Sinus   Minimum heart rate: 29 BPM.  Average heart rate: 63 BPM.  Maximal heart rate 218 BPM.   Atrial arrhythmia: PACs were seen.  Atrial runs were also noted the longest was 21 seconds and the fastest was 13 beats at a rate of 214.  Also  there was activity suggestive of atrial tachycardia with variable block   Ventricular arrhythmia: 2 episodes of ventricular tachycardia the longest lasting 10 beats.  Average heart rate 123.  Ventricular bigeminy and trigeminy was also noted   Conduction abnormality: Significant conduction abnormalities were noted especially in the light of indication for this testing.  They included atrial tachycardia with variable block, AV block of the second and third degree were noted.   Symptoms: As mentioned above dizziness and giddiness     Conclusion:  Markedly abnormal event monitor with episodes of atrial and ventricular tachycardia as noted above.  More significant is the high-grade AV block especially in the setting of significant symptoms for which the monitoring was done. The referring physician Dr Servando Salina is already aware of the findings  and has made arrangements for the patient to be notified.  I have also discussed this report with Dr Servando Salinaobb.  She has contacted our electrophysiology colleagues and and made them aware.    Recent Labs: 09/27/2020: NT-Pro BNP 736 10/14/2020: Hemoglobin 12.9; Platelets 313; TSH 2.587 11/23/2020: BNP 33.7 12/08/2020: BUN 34; Creatinine, Ser 1.35; Magnesium 1.7; Potassium 4.2; Sodium 142  Recent Lipid Panel No results found for: CHOL, TRIG, HDL, CHOLHDL, VLDL, LDLCALC, LDLDIRECT  Physical Exam:    VS:  BP (!) 168/90 (BP Location: Left Arm, Patient Position: Sitting, Cuff Size: Normal)   Pulse 88   Ht 5' (1.524 m)   Wt 237 lb (107.5 kg)   SpO2 94%   BMI 46.29 kg/m     Wt Readings from Last 3 Encounters:  06/16/21 237 lb (107.5 kg)  03/14/21 233 lb (105.7 kg)  01/23/21 234 lb 3.2 oz (106.2 kg)     GEN: Well nourished, well developed in no acute distress HEENT: Normal NECK: No JVD; No carotid bruits LYMPHATICS: No lymphadenopathy CARDIAC: S1S2 noted,RRR, no murmurs, rubs, gallops RESPIRATORY:  Clear to auscultation without rales, wheezing or rhonchi   ABDOMEN: Soft, non-tender, non-distended, +bowel sounds, no guarding. EXTREMITIES: No edema, No cyanosis, no clubbing MUSCULOSKELETAL:  No deformity  SKIN: Warm and dry NEUROLOGIC:  Alert and oriented x 3, non-focal PSYCHIATRIC:  Normal affect, good insight  ASSESSMENT:    1. Essential hypertension   2. CHB (complete heart block) (HCC)   3. S/P placement of cardiac pacemaker, medtronic 10/14/20   4. Morbid obesity (HCC)    PLAN:     1.  She is hypertensive today.  She is taking her medication as prescribed she tells me.  I will increase her hydralazine to 50 mg twice a day.  She will remain on the other medication regimen.  She will take her blood pressure for me and send that information in 2 weeks.  She does be greater than 130/80 mmHg persistently we will optimize her regimen then.  Talked about her diagnostic testing are pending.  The patient prefers not to get this testing done at this time as she has 2 reasons 1 financial concerns with payment and second she does not like to drive to [place].  I have offered to have the patient talk to our financial assistance program and refer her to our social worker colleagues but the patient has declined this. The patient understands the need to lose weight with diet and exercise. We have discussed specific strategies for this.  The patient is in agreement with the above plan. The patient left the office in stable condition.  The patient will follow up in 6 months or sooner if needed.  Based on my transition the patient prefers to stay in [place] and will see one of our providers here.   Medication Adjustments/Labs and Tests Ordered: Current medicines are reviewed at length with the patient today.  Concerns regarding medicines are outlined above.  No orders of the defined types were placed in this encounter.  No orders of the defined types were placed in this encounter.   Patient Instructions  Medication Instructions:  Your physician  has recommended you make the following change in your medication:  INCREASE: Hydralazine 50 mg once daily  *If you need a refill on your cardiac medications before your next appointment, please call your pharmacy*   Lab Work: None If you have labs (blood work) drawn today and your tests are completely normal, you will receive  your results only by: MyChart Message (if you have MyChart) OR A paper copy in the mail If you have any lab test that is abnormal or we need to change your treatment, we will call you to review the results.   Testing/Procedures: None   Follow-Up: At [facility], you and your health needs are our priority.  As part of our continuing mission to provide you with exceptional heart care, we have created designated Provider Care Teams.  These Care Teams include your primary Cardiologist (physician) and Advanced Practice Providers (APPs -  Physician Assistants and Nurse Practitioners) who all work together to provide you with the care you need, when you need it.  We recommend signing up for the patient portal called "MyChart".  Sign up information is provided on this After Visit Summary.  MyChart is used to connect with patients for Virtual Visits (Telemedicine).  Patients are able to view lab/test results, encounter notes, upcoming appointments, etc.  Non-urgent messages can be sent to your provider as well.   To learn more about what you can do with MyChart, go to [email or web address].    Your next appointment:   6 month(s)  The format for your next appointment:   In Person  Provider:   Belva Crome, MD   Other Instructions    Adopting a Healthy Lifestyle.  Know what a healthy weight is for you (roughly BMI <25) and aim to maintain this   Aim for 7+ servings of fruits and vegetables daily   65-80+ fluid ounces of water or unsweet tea for healthy kidneys   Limit to max 1 drink of alcohol per day; avoid smoking/tobacco   Limit animal fats in diet  for cholesterol and heart health - choose grass fed whenever available   Avoid highly processed foods, and foods high in saturated/trans fats   Aim for low stress - take time to unwind and care for your mental health   Aim for 150 min of moderate intensity exercise weekly for heart health, and weights twice weekly for bone health   Aim for 7-9 hours of sleep daily   When it comes to diets, agreement about the perfect plan isnt easy to find, even among the experts. Experts at the [facility] [employer] developed an idea known as the Healthy Eating Plate. Just imagine a plate divided into logical, healthy portions.   The emphasis is on diet quality:   Load up on vegetables and fruits - one-half of your plate: Aim for color and variety, and remember that potatoes dont count.   Go for whole grains - one-quarter of your plate: Whole wheat, barley, wheat berries, quinoa, oats, brown rice, and foods made with them. If you want pasta, go with whole wheat pasta.   Protein power - one-quarter of your plate: Fish, chicken, beans, and nuts are all healthy, versatile protein sources. Limit red meat.   The diet, however, does go beyond the plate, offering a few other suggestions.   Use healthy plant oils, such as olive, canola, soy, corn, sunflower and peanut. Check the labels, and avoid partially hydrogenated oil, which have unhealthy trans fats.   If youre thirsty, drink water. Coffee and tea are good in moderation, but skip sugary drinks and limit milk and dairy products to one or two daily servings.   The type of carbohydrate in the diet is more important than the amount. Some sources of carbohydrates, such as vegetables, fruits, whole grains, and beans-are healthier than others.  Finally, stay active  Signed, Thomasene Ripple, DO  06/16/2021 4:05 PM    [facility]

## 2021-07-03 DIAGNOSIS — M79661 Pain in right lower leg: Secondary | ICD-10-CM | POA: Insufficient documentation

## 2021-07-03 DIAGNOSIS — R2241 Localized swelling, mass and lump, right lower limb: Secondary | ICD-10-CM | POA: Insufficient documentation

## 2021-07-19 ENCOUNTER — Ambulatory Visit (INDEPENDENT_AMBULATORY_CARE_PROVIDER_SITE_OTHER): Payer: Commercial Managed Care - PPO

## 2021-07-19 DIAGNOSIS — I441 Atrioventricular block, second degree: Secondary | ICD-10-CM

## 2021-07-19 LAB — CUP PACEART REMOTE DEVICE CHECK
Battery Remaining Longevity: 140 mo
Battery Voltage: 3.08 V
Brady Statistic AP VP Percent: 6.38 %
Brady Statistic AP VS Percent: 0.06 %
Brady Statistic AS VP Percent: 89.85 %
Brady Statistic AS VS Percent: 3.72 %
Brady Statistic RA Percent Paced: 6.38 %
Brady Statistic RV Percent Paced: 96.23 %
Date Time Interrogation Session: 20220802224349
Implantable Lead Implant Date: 20211029
Implantable Lead Implant Date: 20211029
Implantable Lead Location: 753859
Implantable Lead Location: 753860
Implantable Lead Model: 5076
Implantable Lead Model: 5076
Implantable Pulse Generator Implant Date: 20211029
Lead Channel Impedance Value: 285 Ohm
Lead Channel Impedance Value: 342 Ohm
Lead Channel Impedance Value: 380 Ohm
Lead Channel Impedance Value: 456 Ohm
Lead Channel Pacing Threshold Amplitude: 0.5 V
Lead Channel Pacing Threshold Amplitude: 0.625 V
Lead Channel Pacing Threshold Pulse Width: 0.4 ms
Lead Channel Pacing Threshold Pulse Width: 0.4 ms
Lead Channel Sensing Intrinsic Amplitude: 2.875 mV
Lead Channel Sensing Intrinsic Amplitude: 2.875 mV
Lead Channel Sensing Intrinsic Amplitude: 9.25 mV
Lead Channel Sensing Intrinsic Amplitude: 9.25 mV
Lead Channel Setting Pacing Amplitude: 1.5 V
Lead Channel Setting Pacing Amplitude: 2 V
Lead Channel Setting Pacing Pulse Width: 0.4 ms
Lead Channel Setting Sensing Sensitivity: 1.2 mV

## 2021-08-15 NOTE — Progress Notes (Signed)
Remote pacemaker transmission.   

## 2021-09-16 ENCOUNTER — Other Ambulatory Visit: Payer: Self-pay | Admitting: Cardiology

## 2021-10-18 ENCOUNTER — Ambulatory Visit (INDEPENDENT_AMBULATORY_CARE_PROVIDER_SITE_OTHER): Payer: Commercial Managed Care - PPO

## 2021-10-18 DIAGNOSIS — I441 Atrioventricular block, second degree: Secondary | ICD-10-CM | POA: Diagnosis not present

## 2021-10-18 LAB — CUP PACEART REMOTE DEVICE CHECK
Battery Remaining Longevity: 136 mo
Battery Voltage: 3.05 V
Brady Statistic AP VP Percent: 5.31 %
Brady Statistic AP VS Percent: 0.03 %
Brady Statistic AS VP Percent: 90.29 %
Brady Statistic AS VS Percent: 4.37 %
Brady Statistic RA Percent Paced: 5.28 %
Brady Statistic RV Percent Paced: 95.6 %
Date Time Interrogation Session: 20221101235605
Implantable Lead Implant Date: 20211029
Implantable Lead Implant Date: 20211029
Implantable Lead Location: 753859
Implantable Lead Location: 753860
Implantable Lead Model: 5076
Implantable Lead Model: 5076
Implantable Pulse Generator Implant Date: 20211029
Lead Channel Impedance Value: 285 Ohm
Lead Channel Impedance Value: 323 Ohm
Lead Channel Impedance Value: 361 Ohm
Lead Channel Impedance Value: 437 Ohm
Lead Channel Pacing Threshold Amplitude: 0.5 V
Lead Channel Pacing Threshold Amplitude: 0.625 V
Lead Channel Pacing Threshold Pulse Width: 0.4 ms
Lead Channel Pacing Threshold Pulse Width: 0.4 ms
Lead Channel Sensing Intrinsic Amplitude: 3 mV
Lead Channel Sensing Intrinsic Amplitude: 3 mV
Lead Channel Sensing Intrinsic Amplitude: 4.5 mV
Lead Channel Sensing Intrinsic Amplitude: 4.5 mV
Lead Channel Setting Pacing Amplitude: 1.5 V
Lead Channel Setting Pacing Amplitude: 2 V
Lead Channel Setting Pacing Pulse Width: 0.4 ms
Lead Channel Setting Sensing Sensitivity: 1.2 mV

## 2021-10-24 NOTE — Progress Notes (Signed)
Remote pacemaker transmission.   

## 2021-10-25 ENCOUNTER — Telehealth: Payer: Self-pay | Admitting: Cardiology

## 2021-10-25 NOTE — Telephone Encounter (Signed)
Called to see if patient can be seen earlier. Patient had a Heart monitor put on him and from the results patient is in afib. Please advise

## 2021-10-25 NOTE — Telephone Encounter (Signed)
Office closed at time of call.

## 2021-10-27 NOTE — Telephone Encounter (Signed)
Dr. Bing Matter reviewed. Reports does not read afib. Patient is fine until December. Will let office know when they are open.

## 2021-10-27 NOTE — Telephone Encounter (Signed)
Called office asked them to fax [place] the monitor report for Dr. Bing Matter to review.

## 2021-10-30 ENCOUNTER — Other Ambulatory Visit: Payer: Self-pay

## 2021-10-30 ENCOUNTER — Encounter: Payer: Self-pay | Admitting: *Deleted

## 2021-10-30 ENCOUNTER — Encounter: Payer: Self-pay | Admitting: Cardiology

## 2021-10-30 ENCOUNTER — Ambulatory Visit (INDEPENDENT_AMBULATORY_CARE_PROVIDER_SITE_OTHER): Payer: Commercial Managed Care - PPO | Admitting: Cardiology

## 2021-10-30 VITALS — BP 142/80 | HR 92 | Resp 18 | Ht 60.0 in | Wt 240.0 lb

## 2021-10-30 DIAGNOSIS — I441 Atrioventricular block, second degree: Secondary | ICD-10-CM

## 2021-10-30 NOTE — Progress Notes (Signed)
Electrophysiology Office Note   Date:  10/30/2021   ID:  Cynthia Archer, DOB 24-Jul-1961, MRN [id number]  PCP:  Cynthia Severin, NP  Cardiologist:  Cynthia Archer Primary Electrophysiologist:  Cynthia Archer Cynthia Loa, MD    Chief Complaint: pacemaker   History of Present Illness: Cynthia Archer is a 60 y.o. female who is being seen today for the evaluation of pacemaker at the request of York, Cynthia F, NP. Presenting today for electrophysiology evaluation.  She has a history STEMI of hypertension, hypothyroidism, obesity, Mobitz 1 AV block.  She was seen in the hospital in 2021 with event second-degree AV block.  She is status post Medtronic dual-chamber pacemaker implanted 10/14/2020.  At her last visit, she was hypertensive.  She complains of daily fatigue, morning headaches, lower extremity edema.  A sleep study was ordered.  Today, denies symptoms of palpitations, chest pain, shortness of breath, orthopnea, PND, lower extremity edema, claudication, dizziness, presyncope, syncope, bleeding, or neurologic sequela. The patient is tolerating medications without difficulties.  She continues to get short of breath.  She is short of breath doing multiple activities.  She has no chest pain associated with her shortness of breath.  She also gets morning headaches.  Her sleep study is planned in the next 2 weeks.    Past Medical History:  Diagnosis Date   CHB (complete heart block) (HCC) 10/15/2020   Degeneration of lumbar intervertebral disc 02/08/2020   Essential hypertension 02/26/2017   Hypercalcemia 02/26/2017   Hyperparathyroidism (HCC) 02/26/2017   S/P placement of cardiac pacemaker, medtronic 10/14/20 10/15/2020   Second degree AV block 10/14/2020   Symptomatic bradycardia 10/15/2020   Thyroid Archer    Vitamin D deficiency 02/26/2017   Past Surgical History:  Procedure Laterality Date   ANKLE SURGERY     PACEMAKER IMPLANT N/A 10/14/2020   Procedure: PACEMAKER IMPLANT;  Surgeon: Cynthia Lemming, MD;  [facility]: [facility];  Service: Cardiovascular;  Laterality: N/A;   THYROID SURGERY     TUBAL LIGATION       Current Outpatient Medications  Medication Sig Dispense Refill   acetaminophen (TYLENOL) 650 MG CR tablet Take 650 mg by mouth every 8 (eight) hours as needed for pain.     amLODipine (NORVASC) 10 MG tablet TAKE 1 TABLET BY MOUTH EVERY DAY 90 tablet 3   furosemide (LASIX) 40 MG tablet Take 1 tablet (40 mg total) by mouth daily. 90 tablet 3   hydrALAZINE (APRESOLINE) 50 MG tablet Take 1 tablet (50 mg total) by mouth in the morning and at bedtime. 180 tablet 3   levothyroxine (SYNTHROID) 50 MCG tablet Take 50 mcg by mouth daily at 6 (six) AM.      naproxen sodium (ALEVE) 220 MG tablet Take 440 mg by mouth 2 (two) times daily with a meal.     omeprazole (PRILOSEC) 40 MG capsule Take 40 mg by mouth daily at 6 (six) AM.      potassium chloride SA (KLOR-CON) 20 MEQ tablet Take 20 mEq by mouth daily.     rosuvastatin (CRESTOR) 10 MG tablet Take 10 mg by mouth daily at 6 (six) AM.     nitroGLYCERIN (NITROSTAT) 0.4 MG SL tablet Place 1 tablet (0.4 mg total) under the tongue every 5 (five) minutes as needed for chest pain. 25 tablet 11   No current facility-administered medications for this visit.    Allergies:   Patient has no known allergies.   Social History:  The patient  reports that she has never smoked. She has never used smokeless tobacco. She reports that she does not currently use alcohol. She reports that she does not use drugs.   Family History:  The patient's family history includes Alzheimer's Archer in her maternal grandmother; COPD in her father; Heart Archer in her mother; Kidney failure in her mother; Lung cancer in her father, maternal grandfather, and paternal grandfather.   ROS:  Please see the history of present illness.   Otherwise, review of systems is positive for none.   All other systems are reviewed and negative.   PHYSICAL EXAM: VS:   BP (!) 142/80   Pulse 92   Resp 18   Ht 5' (1.524 m)   Wt 240 lb (108.9 kg)   SpO2 98%   BMI 46.87 kg/m  , BMI Body mass index is 46.87 kg/m. GEN: Well nourished, well developed, in no acute distress  HEENT: normal  Neck: no JVD, carotid bruits, or masses Cardiac: RRR; no murmurs, rubs, or gallops,no edema  Respiratory:  clear to auscultation bilaterally, normal work of breathing GI: soft, nontender, nondistended, + BS MS: no deformity or atrophy  Skin: warm and dry, device site well healed Neuro:  Strength and sensation are intact Psych: euthymic mood, full affect  EKG:  EKG is ordered today. Personal review of the ekg ordered shows atrial sensed, ventricular paced, rate 92, PAC  Personal review of the device interrogation today. Results in Paceart   Recent Labs: 11/23/2020: BNP 33.7 12/08/2020: BUN 34; Creatinine, Ser 1.35; Magnesium 1.7; Potassium 4.2; Sodium 142    Lipid Panel  No results found for: CHOL, TRIG, HDL, CHOLHDL, VLDL, LDLCALC, LDLDIRECT   Wt Readings from Last 3 Encounters:  10/30/21 240 lb (108.9 kg)  06/16/21 237 lb (107.5 kg)  03/14/21 233 lb (105.7 kg)      Other studies Reviewed: Additional studies/ records that were reviewed today include: TTE 09/27/20  Review of the above records today demonstrates:  EF 60 to 65% Grade 1 diastolic dysfunction Right ventricle normal size and function Left atrium normal in size Trace to mild mitral regurgitation   ASSESSMENT AND PLAN:  1.  Second-degree AV block: Status post Medtronic dual-chamber pacemaker implanted 10/14/2020.  Device functioning appropriately.  No changes at this time.  2.  Hypertension: Mildly elevated today.  I think that sleep apnea is contributing to her hypertension.  She is getting a sleep study within the next 2 weeks.  3.  Chronic diastolic heart failure: Has lower extremity edema with some shortness of breath.  Shortness of breath could be due to diastolic heart failure or  sleep apnea, though with her edema Cynthia Archer increase Lasix to 40 mg twice daily for 3 days.  4.  Snoring: Has sleep study planned for the next 2 weeks.  This Cynthia Archer likely help with her multiple symptoms.   Current medicines are reviewed at length with the patient today.   The patient does not have concerns regarding her medicines.  The following changes were made today: Increase Lasix  Labs/ tests ordered today include:  Orders Placed This Encounter  Procedures   EKG 12-Lead      Disposition:   FU with Lurie Mullane 12 months  Signed, Sivan Quast Cynthia Loa, MD  10/30/2021 10:24 AM     [facility] [address] [place] [address] [phone] (office) [phone] (fax)

## 2021-10-30 NOTE — Telephone Encounter (Signed)
Annice Pih aware that patient doesn't need to be seen sooner than 11/30/21, patient not in afib.

## 2021-10-30 NOTE — Patient Instructions (Addendum)
Medication Instructions:  Your physician has recommended you make the following change in your medication:  TAKE  Lasix TWICE daily for the next 3 days then return to normal daily dosing of 40 mg ONCE a day  *If you need a refill on your cardiac medications before your next appointment, please call your pharmacy*   Lab Work: None ordered   Testing/Procedures: None ordered   Follow-Up: At [facility], you and your health needs are our priority.  As part of our continuing mission to provide you with exceptional heart care, we have created designated Provider Care Teams.  These Care Teams include your primary Cardiologist (physician) and Advanced Practice Providers (APPs -  Physician Assistants and Nurse Practitioners) who all work together to provide you with the care you need, when you need it.  We recommend signing up for the patient portal called "MyChart".  Sign up information is provided on this After Visit Summary.  MyChart is used to connect with patients for Virtual Visits (Telemedicine).  Patients are able to view lab/test results, encounter notes, upcoming appointments, etc.  Non-urgent messages can be sent to your provider as well.   To learn more about what you can do with MyChart, go to [email or web address].    Remote monitoring is used to monitor your Pacemaker or ICD from home. This monitoring reduces the number of office visits required to check your device to one time per year. It allows [place] to keep an eye on the functioning of your device to ensure it is working properly. You are scheduled for a device check from home on 01/17/2022. You may send your transmission at any time that day. If you have a wireless device, the transmission will be sent automatically. After your physician reviews your transmission, you will receive a postcard with your next transmission date.  Your next appointment:   1 year(s)  The format for your next appointment:   In Person  Provider:    Loman Brooklyn, MD   Thank you for choosing [facility]!!   Dory Horn, RN [phone]

## 2021-11-30 ENCOUNTER — Ambulatory Visit: Payer: Commercial Managed Care - PPO | Admitting: Cardiology

## 2022-01-17 ENCOUNTER — Ambulatory Visit (INDEPENDENT_AMBULATORY_CARE_PROVIDER_SITE_OTHER): Payer: Commercial Managed Care - PPO

## 2022-01-17 DIAGNOSIS — I441 Atrioventricular block, second degree: Secondary | ICD-10-CM

## 2022-01-17 LAB — CUP PACEART REMOTE DEVICE CHECK
Battery Remaining Longevity: 133 mo
Battery Voltage: 3.04 V
Brady Statistic AP VP Percent: 5.04 %
Brady Statistic AP VS Percent: 0.01 %
Brady Statistic AS VP Percent: 90.41 %
Brady Statistic AS VS Percent: 4.53 %
Brady Statistic RA Percent Paced: 5.03 %
Brady Statistic RV Percent Paced: 95.45 %
Date Time Interrogation Session: 20230201025404
Implantable Lead Implant Date: 20211029
Implantable Lead Implant Date: 20211029
Implantable Lead Location: 753859
Implantable Lead Location: 753860
Implantable Lead Model: 5076
Implantable Lead Model: 5076
Implantable Pulse Generator Implant Date: 20211029
Lead Channel Impedance Value: 285 Ohm
Lead Channel Impedance Value: 323 Ohm
Lead Channel Impedance Value: 361 Ohm
Lead Channel Impedance Value: 437 Ohm
Lead Channel Pacing Threshold Amplitude: 0.625 V
Lead Channel Pacing Threshold Amplitude: 0.625 V
Lead Channel Pacing Threshold Pulse Width: 0.4 ms
Lead Channel Pacing Threshold Pulse Width: 0.4 ms
Lead Channel Sensing Intrinsic Amplitude: 2.75 mV
Lead Channel Sensing Intrinsic Amplitude: 2.75 mV
Lead Channel Sensing Intrinsic Amplitude: 6.5 mV
Lead Channel Sensing Intrinsic Amplitude: 6.5 mV
Lead Channel Setting Pacing Amplitude: 1.5 V
Lead Channel Setting Pacing Amplitude: 2 V
Lead Channel Setting Pacing Pulse Width: 0.4 ms
Lead Channel Setting Sensing Sensitivity: 1.2 mV

## 2022-01-22 ENCOUNTER — Encounter: Payer: Commercial Managed Care - PPO | Admitting: Cardiology

## 2022-01-24 NOTE — Progress Notes (Signed)
Remote pacemaker transmission.   

## 2022-03-25 IMAGING — DX DG CHEST 2V
2 series · 2 of 2 positions shown · non-contrast
Comparison: 10/02/2008

CLINICAL DATA: Irregular heartbeat

EXAM:
CHEST - 2 VIEW

[chest pa]
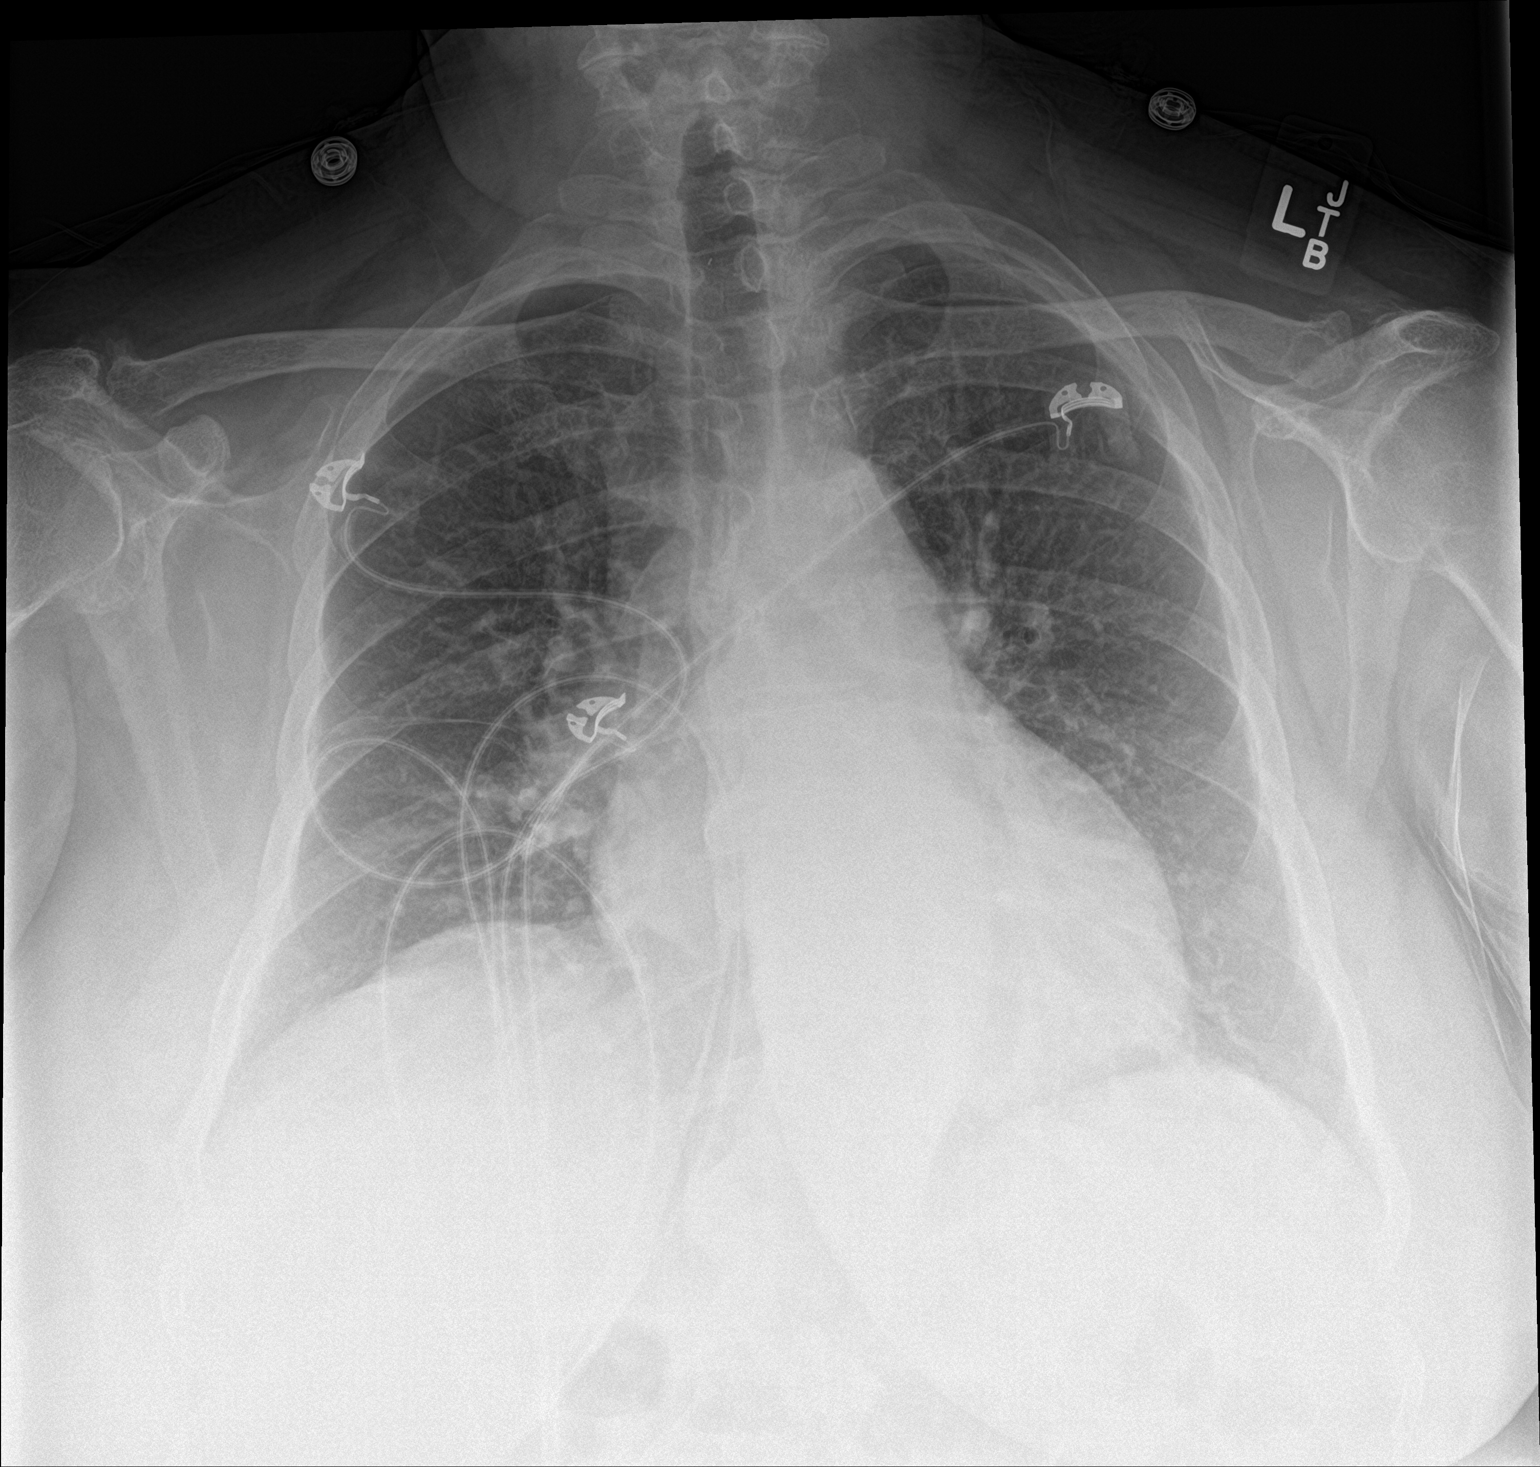

[chest lat]
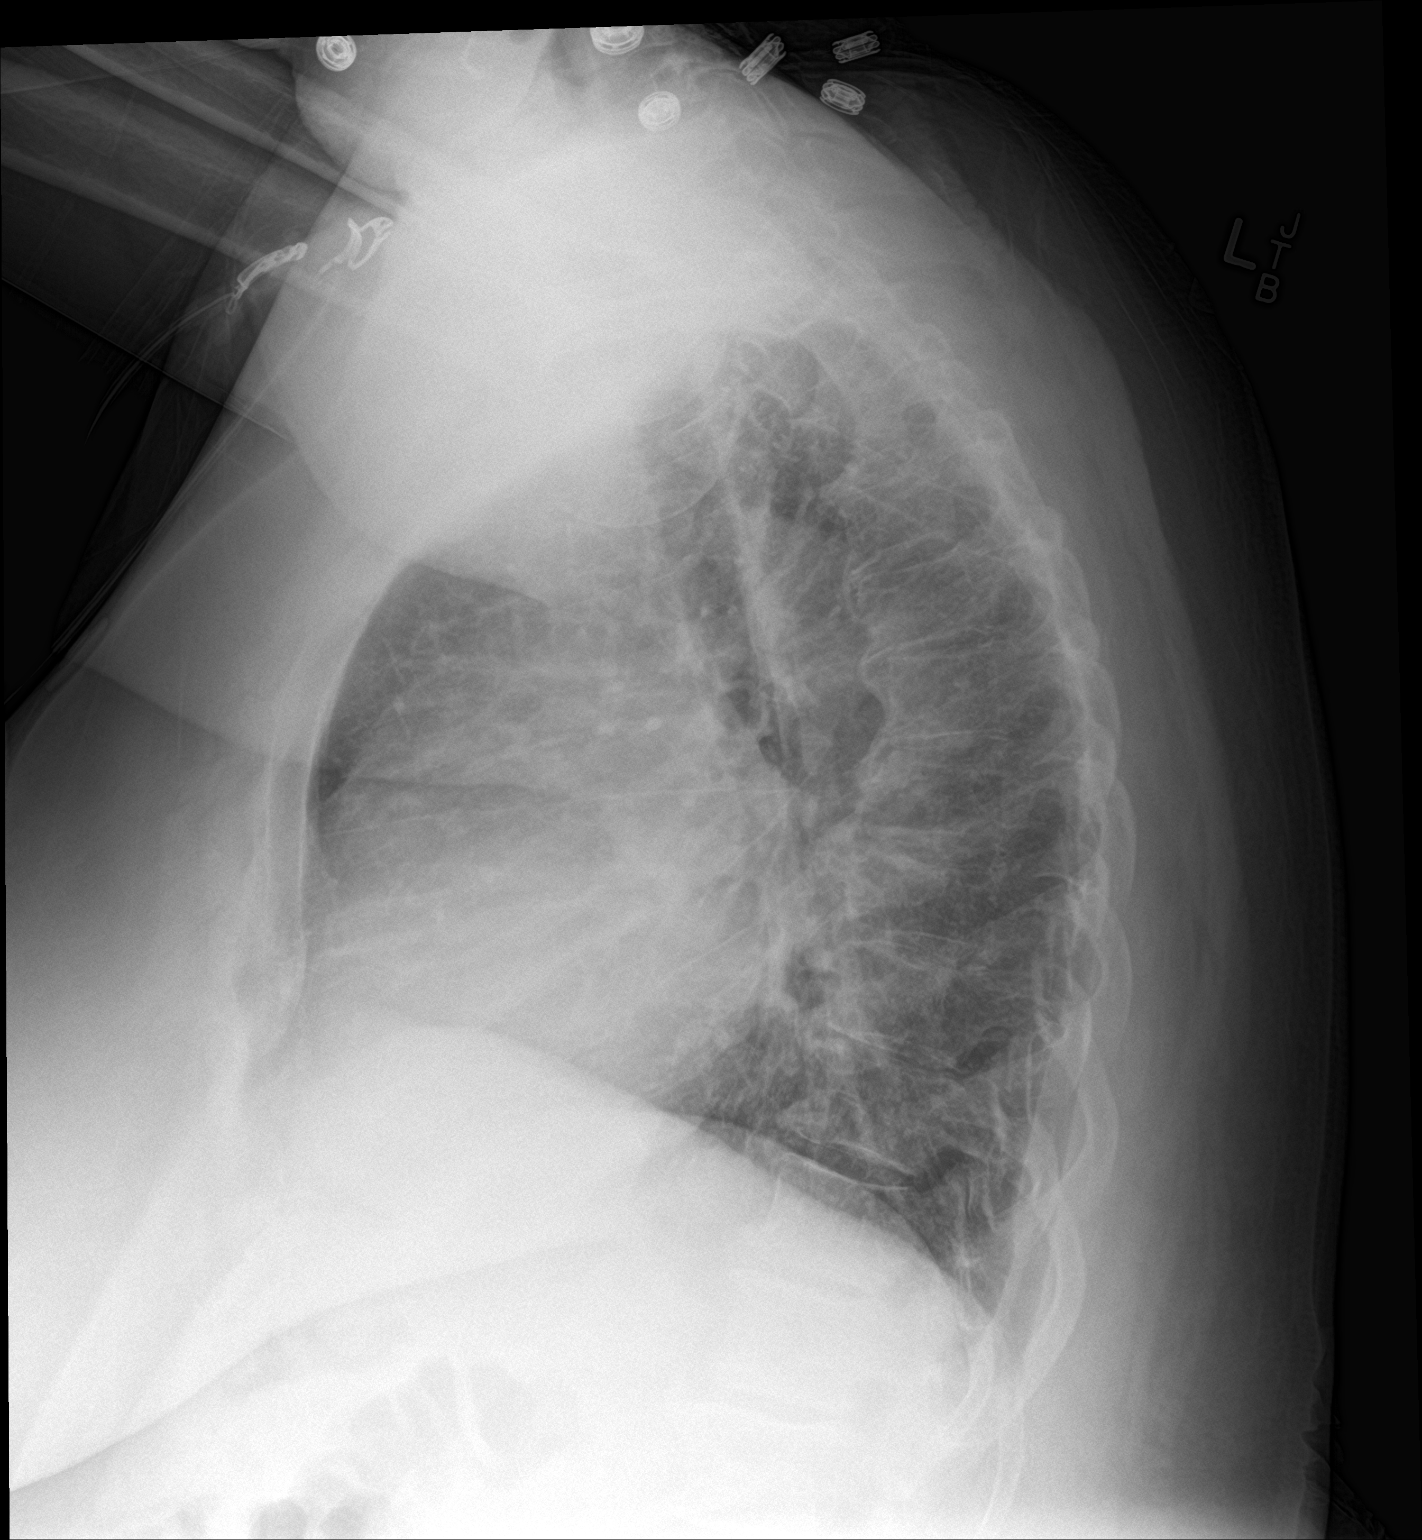

[2 of 2 positions shown; findings below may reference images not displayed]

FINDINGS: Heart size upper normal. Vascularity normal. Lungs clear without
infiltrate or effusion.
IMPRESSION: No acute cardiopulmonary abnormality.

## 2022-03-26 IMAGING — DX DG CHEST 2V
2 series · 2 of 2 positions shown · non-contrast
Comparison: 10/14/2020

CLINICAL DATA: S/P pacemaker insertion

EXAM:
CHEST - 2 VIEW

[chest pa]
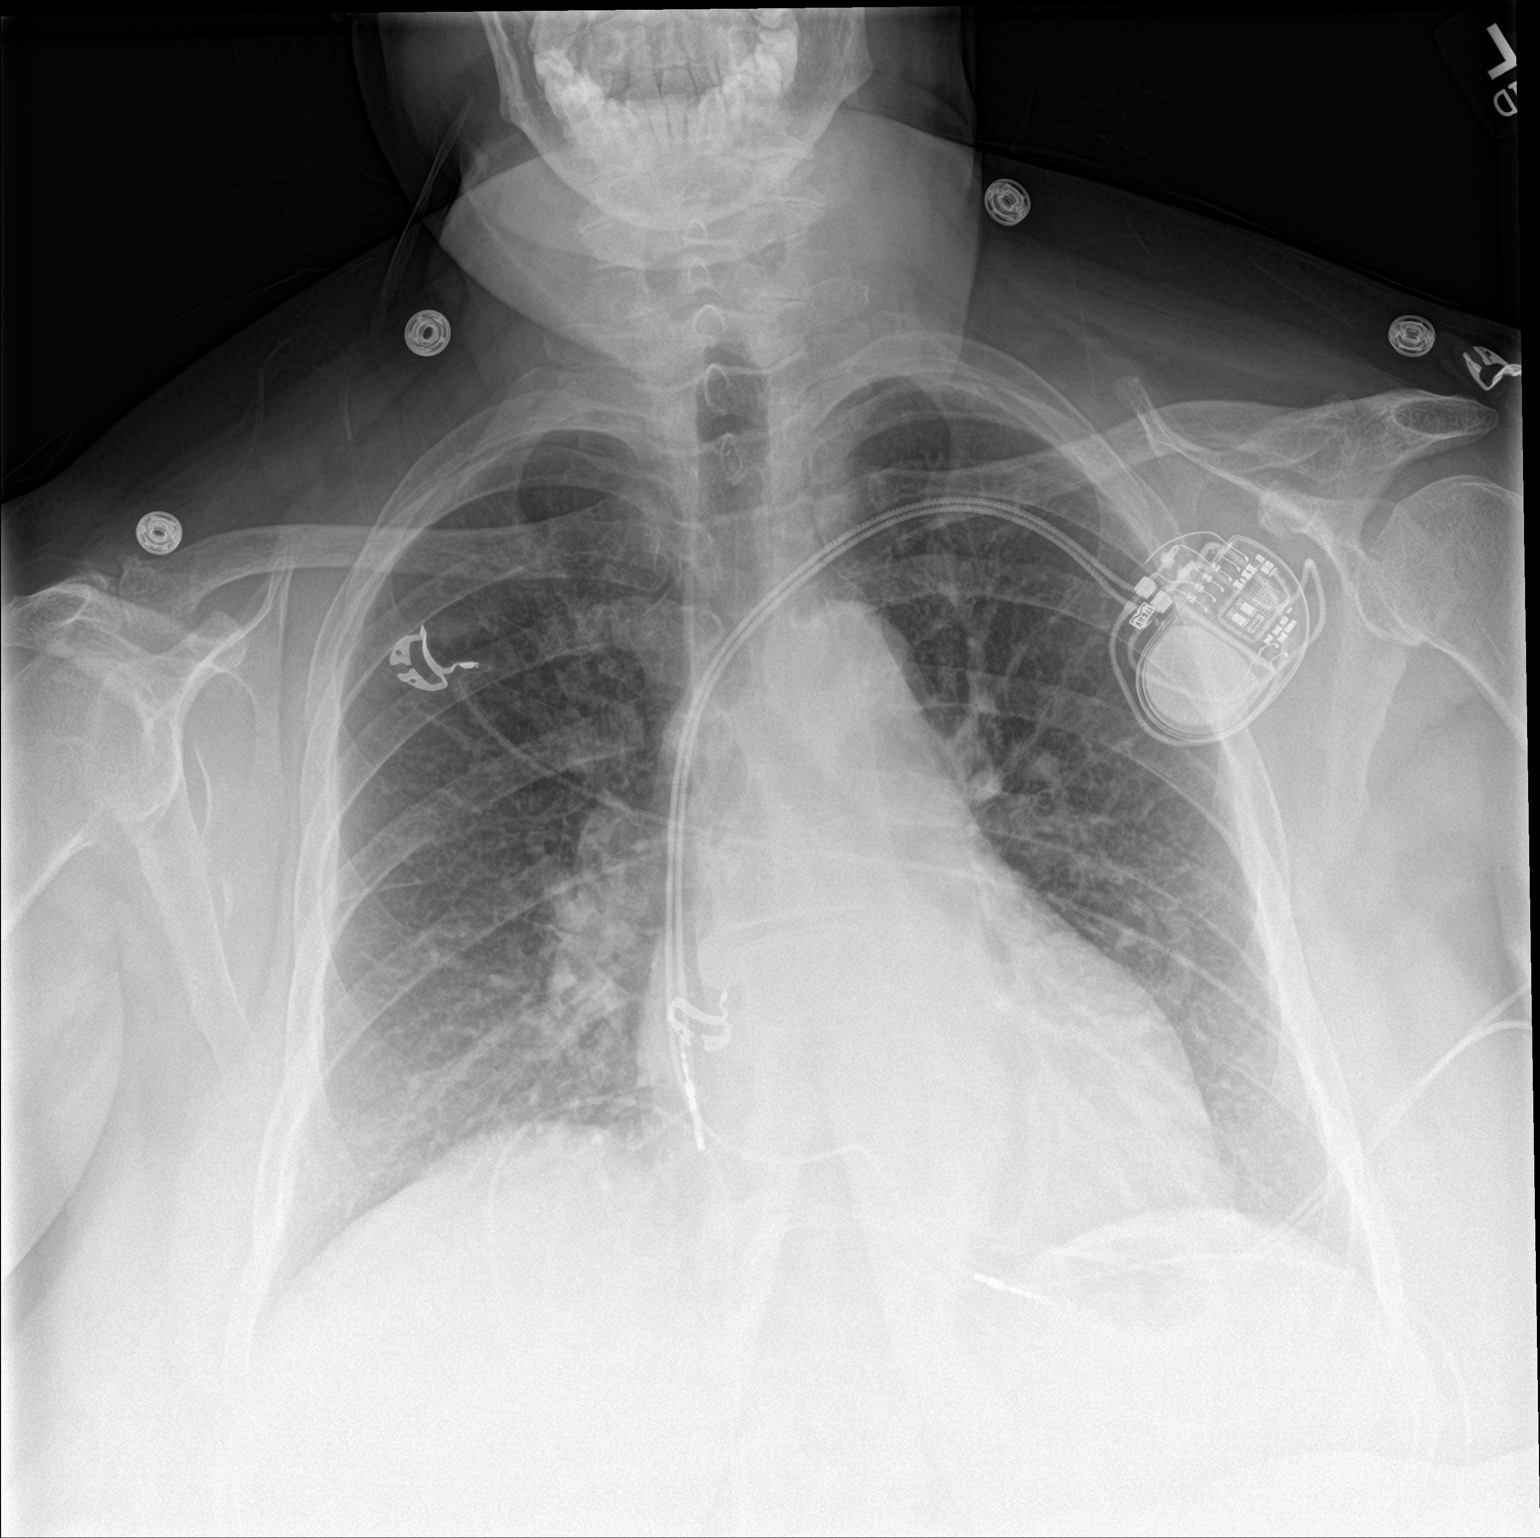

[chest lat]
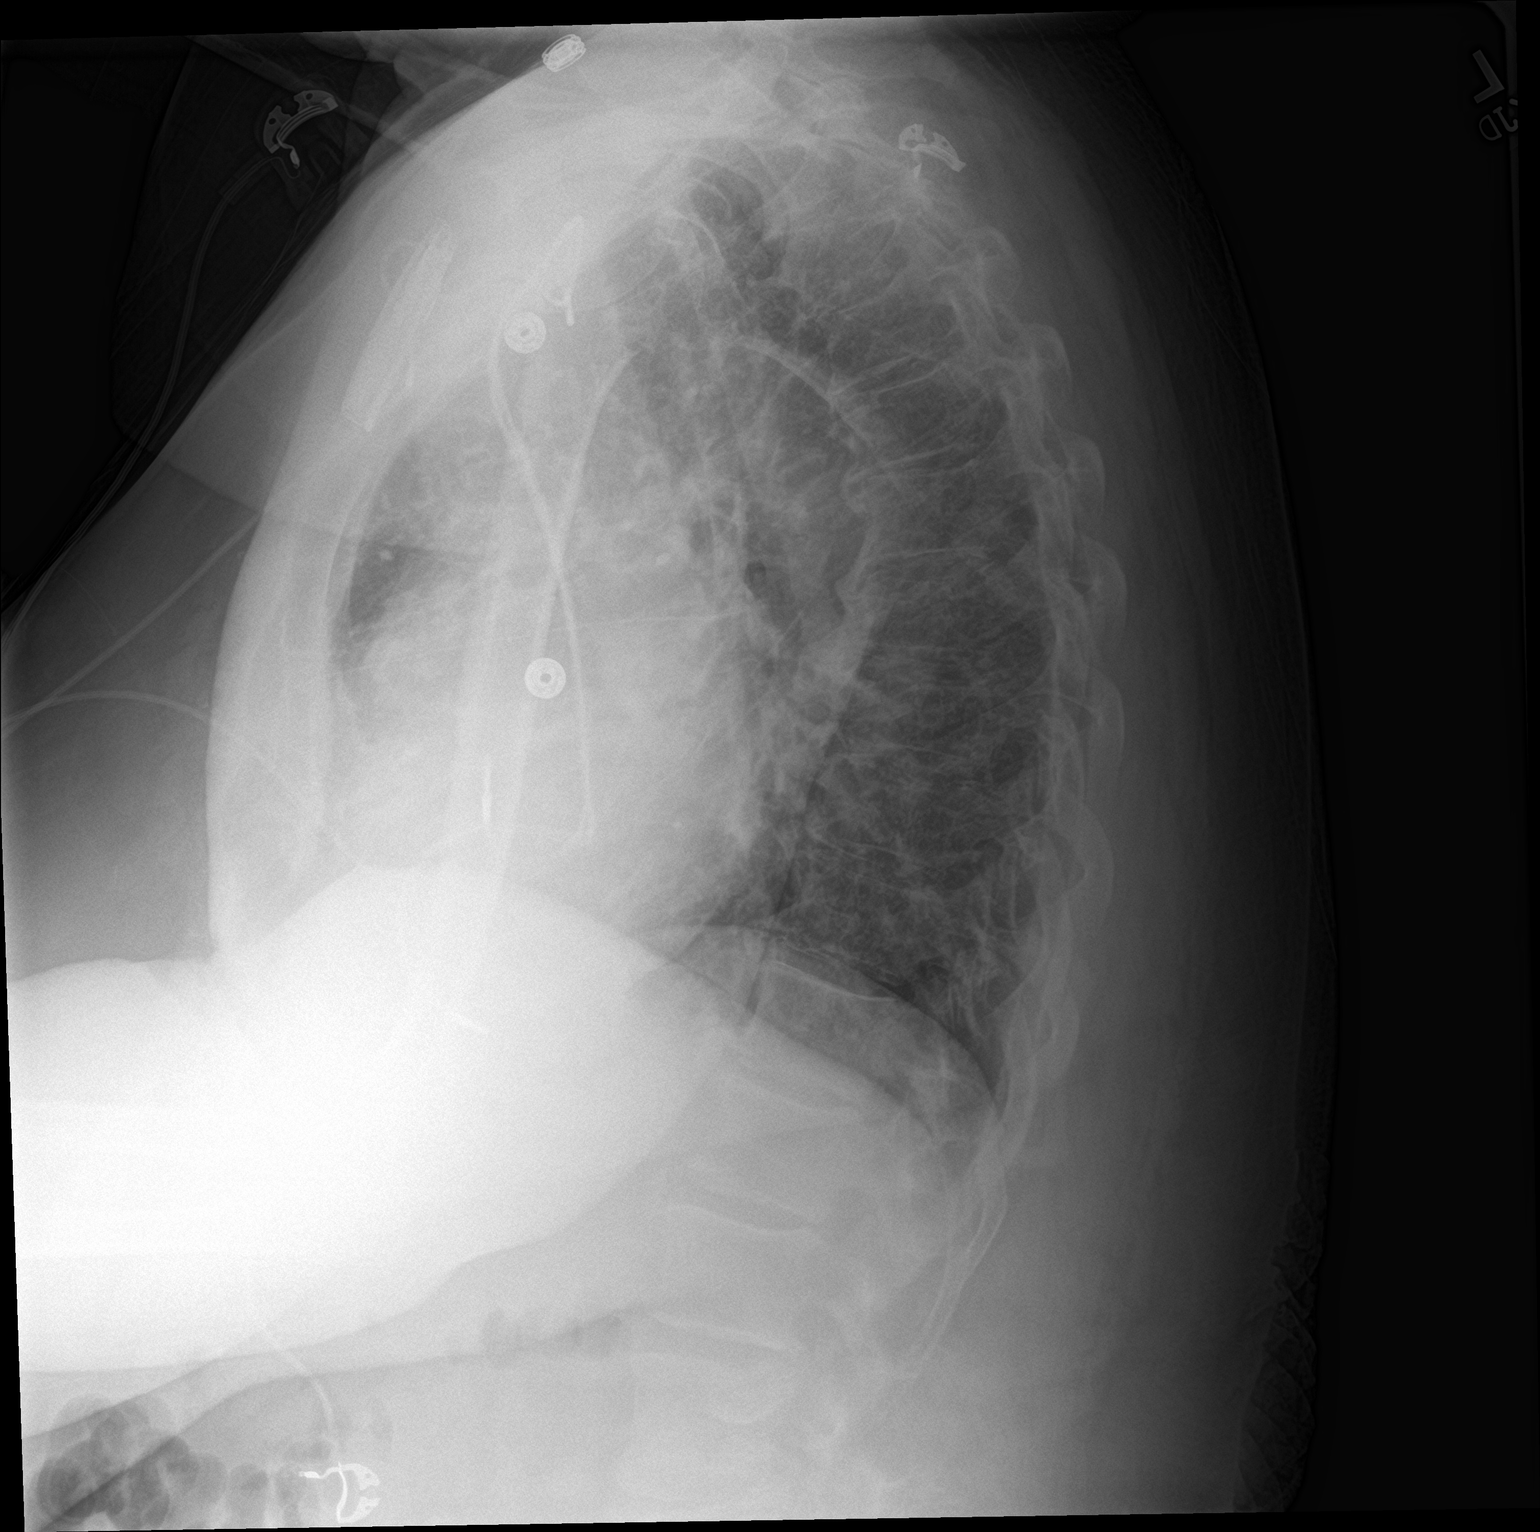

[2 of 2 positions shown; findings below may reference images not displayed]

FINDINGS: New left anterior chest wall sequential pacemaker, with the leads
projecting within the right atrium and right ventricle.

No pneumothorax.

Cardiac silhouette normal in size. No mediastinal or hilar masses or
evidence of adenopathy.

Clear lungs.
IMPRESSION: 1. Well-positioned left anterior chest wall sequential pacemaker.
2. No acute cardiopulmonary disease.  No pneumothorax.

## 2022-03-27 ENCOUNTER — Ambulatory Visit: Payer: Commercial Managed Care - PPO | Admitting: Cardiology

## 2022-03-27 ENCOUNTER — Encounter: Payer: Self-pay | Admitting: Cardiology

## 2022-03-27 VITALS — BP 146/72 | HR 90 | Ht 60.0 in | Wt 236.6 lb

## 2022-03-27 DIAGNOSIS — I10 Essential (primary) hypertension: Secondary | ICD-10-CM | POA: Diagnosis not present

## 2022-03-27 DIAGNOSIS — I441 Atrioventricular block, second degree: Secondary | ICD-10-CM

## 2022-03-27 DIAGNOSIS — Z950 Presence of cardiac pacemaker: Secondary | ICD-10-CM | POA: Diagnosis not present

## 2022-03-27 DIAGNOSIS — R0609 Other forms of dyspnea: Secondary | ICD-10-CM

## 2022-03-27 NOTE — Patient Instructions (Signed)
Medication Instructions:  ?Your physician recommends that you continue on your current medications as directed. Please refer to the Current Medication list given to you today. ? ?*If you need a refill on your cardiac medications before your next appointment, please call your pharmacy* ? ? ?Lab Work: ?Your physician recommends that you return for lab work in: Today for Basic Metabolic Panel ? ?If you have labs (blood work) drawn today and your tests are completely normal, you will receive your results only by: ?MyChart Message (if you have MyChart) OR ?A paper copy in the mail ?If you have any lab test that is abnormal or we need to change your treatment, we will call you to review the results. ? ? ?Testing/Procedures: ?Your physician has requested that you have an echocardiogram. Echocardiography is a painless test that uses sound waves to create images of your heart. It provides your doctor with information about the size and shape of your heart and how well your heart?s chambers and valves are working. This procedure takes approximately one hour. There are no restrictions for this procedure.  ? ? ?Follow-Up: ?At [facility], you and your health needs are our priority.  As part of our continuing mission to provide you with exceptional heart care, we have created designated Provider Care Teams.  These Care Teams include your primary Cardiologist (physician) and Advanced Practice Providers (APPs -  Physician Assistants and Nurse Practitioners) who all work together to provide you with the care you need, when you need it. ? ?We recommend signing up for the patient portal called "MyChart".  Sign up information is provided on this After Visit Summary.  MyChart is used to connect with patients for Virtual Visits (Telemedicine).  Patients are able to view lab/test results, encounter notes, upcoming appointments, etc.  Non-urgent messages can be sent to your provider as well.   ?To learn more about what you can do with  MyChart, go to [email or web address].   ? ?Your next appointment:   ?6 month(s) ? ?The format for your next appointment:   ?In Person ? ?Provider:   ?Gypsy Balsam, MD  ? ? ?Other Instructions ? ? ?Important Information About Sugar ? ? ? ? ?  ?

## 2022-03-27 NOTE — Progress Notes (Signed)
?Cardiology Office Note:   ? ?Date:  03/27/2022  ? ?ID:  Cynthia Archer, DOB [birth year], MRN [id number] ? ?PCP:  Dema Severin, NP  ?Cardiologist:  Gypsy Balsam, MD   ? ?Referring MD: Dema Severin, NP  ? ?Chief Complaint  ?Patient presents with  ? Follow-up  ?I am doing fine ? ?History of Present Illness:   ? ?Cynthia Archer is a 61 y.o. female with past medical history significant for diastolic congestive heart failure, complete heart block required pacemaker which being implanted in October 2021 with Medtronic device, dyslipidemia, essential hypertension, chronic swelling of lower extremities, morbid obesity. ?.  She comes today to my office for follow-up overall she says she is doing fine denies any dizziness passing out palpitation she does complain of having some swelling of lower extremities especially at evening time. ? ?Past Medical History:  ?Diagnosis Date  ? CHB (complete heart block) (HCC) 10/15/2020  ? Degeneration of lumbar intervertebral disc 02/08/2020  ? Essential hypertension 02/26/2017  ? Hypercalcemia 02/26/2017  ? Hyperparathyroidism (HCC) 02/26/2017  ? S/P placement of cardiac pacemaker, medtronic 10/14/20 10/15/2020  ? Second degree AV block 10/14/2020  ? Symptomatic bradycardia 10/15/2020  ? Thyroid disease   ? Vitamin D deficiency 02/26/2017  ? ? ?Past Surgical History:  ?Procedure Laterality Date  ? ANKLE SURGERY    ? PACEMAKER IMPLANT N/A 10/14/2020  ? Procedure: PACEMAKER IMPLANT;  Surgeon: Regan Lemming, MD;  Location: [facility];  Service: Cardiovascular;  Laterality: N/A;  ? THYROID SURGERY    ? TUBAL LIGATION    ? ? ?Current Medications: ?Current Meds  ?Medication Sig  ? acetaminophen (TYLENOL) 650 MG CR tablet Take 650 mg by mouth every 8 (eight) hours as needed for pain.  ? amLODipine (NORVASC) 10 MG tablet TAKE 1 TABLET BY MOUTH EVERY DAY (Patient taking differently: Take 10 mg by mouth daily.)  ? furosemide (LASIX) 40 MG tablet Take 1 tablet (40 mg total) by mouth  daily.  ? hydrALAZINE (APRESOLINE) 50 MG tablet Take 1 tablet (50 mg total) by mouth in the morning and at bedtime.  ? levothyroxine (SYNTHROID) 50 MCG tablet Take 50 mcg by mouth daily at 6 (six) AM.   ? naproxen sodium (ALEVE) 220 MG tablet Take 440 mg by mouth 2 (two) times daily with a meal.  ? nitroGLYCERIN (NITROSTAT) 0.4 MG SL tablet Place 1 tablet (0.4 mg total) under the tongue every 5 (five) minutes as needed for chest pain.  ? omeprazole (PRILOSEC) 40 MG capsule Take 40 mg by mouth daily at 6 (six) AM.   ? potassium chloride SA (KLOR-CON) 20 MEQ tablet Take 20 mEq by mouth daily.  ? rosuvastatin (CRESTOR) 10 MG tablet Take 10 mg by mouth daily at 6 (six) AM.  ?  ? ?Allergies:   Patient has no known allergies.  ? ?Social History  ? ?Socioeconomic History  ? Marital status: Married  ?  Spouse name: Not on file  ? Number of children: Not on file  ? Years of education: Not on file  ? Highest education level: Not on file  ?Occupational History  ? Not on file  ?Tobacco Use  ? Smoking status: Never  ? Smokeless tobacco: Never  ?Substance and Sexual Activity  ? Alcohol use: Not Currently  ? Drug use: Never  ? Sexual activity: Not on file  ?Other Topics Concern  ? Not on file  ?Social History Narrative  ? Not on file  ? ?Social  Determinants of Health  ? ?Financial Resource Strain: Not on file  ?Food Insecurity: Not on file  ?Transportation Needs: Not on file  ?Physical Activity: Not on file  ?Stress: Not on file  ?Social Connections: Not on file  ?  ? ?Family History: ?The patient's family history includes Alzheimer's disease in her maternal grandmother; COPD in her father; Heart disease in her mother; Kidney failure in her mother; Lung cancer in her father, maternal grandfather, and paternal grandfather. ?ROS:   ?Please see the history of present illness.    ?All 14 point review of systems negative except as described per history of present illness ? ?EKGs/Labs/Other Studies Reviewed:   ? ? ? ?Recent Labs: ?No  results found for requested labs within last 8760 hours.  ?Recent Lipid Panel ?No results found for: CHOL, TRIG, HDL, CHOLHDL, VLDL, LDLCALC, LDLDIRECT ? ?Physical Exam:   ? ?VS:  BP (!) 146/72 (BP Location: Left Arm, Patient Position: Sitting)   Pulse 90   Ht 5' (1.524 m)   Wt 236 lb 9.6 oz (107.3 kg)   SpO2 93%   BMI 46.21 kg/m?    ? ?Wt Readings from Last 3 Encounters:  ?03/27/22 236 lb 9.6 oz (107.3 kg)  ?10/30/21 240 lb (108.9 kg)  ?06/16/21 237 lb (107.5 kg)  ?  ? ?GEN:  Well nourished, well developed in no acute distress ?HEENT: Normal ?NECK: No JVD; No carotid bruits ?LYMPHATICS: No lymphadenopathy ?CARDIAC: RRR, no murmurs, no rubs, no gallops ?RESPIRATORY:  Clear to auscultation without rales, wheezing or rhonchi  ?ABDOMEN: Soft, non-tender, non-distended ?MUSCULOSKELETAL:  No edema; No deformity  ?SKIN: Warm and dry ?LOWER EXTREMITIES: no swelling ?NEUROLOGIC:  Alert and oriented x 3 ?PSYCHIATRIC:  Normal affect  ? ?ASSESSMENT:   ? ?1. Essential hypertension   ?2. Second degree AV block   ?3. S/P placement of cardiac pacemaker, medtronic 10/14/20   ?4. Morbid obesity (HCC)   ?5. Dyspnea on exertion   ? ?PLAN:   ? ?In order of problems listed above: ? ?Essential hypertension blood pressure seems to be controlled continue present management she said she check her blood pressure at home usually systolic blood pressures 130 slightly elevated today in the office I continue present management. ?Pacemaker present is a Medtronic device I did review the interrogation seems to be functioning properly. ?Obesity obviously a problem weight loss will be significantly beneficial for both diastolic dysfunction as well as high blood pressure. ?Dyspnea on exertion which is most likely related to diastolic dysfunction as well as obesity.  I will ask her to have echocardiogram done to recheck left ventricle systolic function ?Swelling of lower extremities: She is taking 10 mg of Norvasc which is contributing to this  problem I offered to switch of her medications I want to switch her from amlodipine or maybe put her on amlodipine 5 mg daily and stop her potassium and give her Aldactone 25 but I would like to check her kidney function first which we will do today ? ? ?Medication Adjustments/Labs and Tests Ordered: ?Current medicines are reviewed at length with the patient today.  Concerns regarding medicines are outlined above.  ?No orders of the defined types were placed in this encounter. ? ?Medication changes: No orders of the defined types were placed in this encounter. ? ? ?Signed, ?Georgeanna Lea, MD, [facility] ?03/27/2022 3:58 PM    ?[facility] ?

## 2022-03-28 LAB — BASIC METABOLIC PANEL
BUN/Creatinine Ratio: 24 (ref 12–28)
BUN: 33 mg/dL — ABNORMAL HIGH (ref 8–27)
CO2: 24 mmol/L (ref 20–29)
Calcium: 10.2 mg/dL (ref 8.7–10.3)
Chloride: 102 mmol/L (ref 96–106)
Creatinine, Ser: 1.36 mg/dL — ABNORMAL HIGH (ref 0.57–1.00)
Glucose: 96 mg/dL (ref 70–99)
Potassium: 3.9 mmol/L (ref 3.5–5.2)
Sodium: 140 mmol/L (ref 134–144)
eGFR: 44 mL/min/{1.73_m2} — ABNORMAL LOW (ref >59)

## 2022-04-06 ENCOUNTER — Ambulatory Visit (INDEPENDENT_AMBULATORY_CARE_PROVIDER_SITE_OTHER): Payer: Commercial Managed Care - PPO

## 2022-04-06 DIAGNOSIS — I10 Essential (primary) hypertension: Secondary | ICD-10-CM

## 2022-04-06 DIAGNOSIS — I441 Atrioventricular block, second degree: Secondary | ICD-10-CM

## 2022-04-06 DIAGNOSIS — R0609 Other forms of dyspnea: Secondary | ICD-10-CM

## 2022-04-06 DIAGNOSIS — Z950 Presence of cardiac pacemaker: Secondary | ICD-10-CM | POA: Diagnosis not present

## 2022-04-06 LAB — ECHOCARDIOGRAM COMPLETE
Area-P 1/2: 4.15 cm2
S' Lateral: 3.3 cm

## 2022-04-18 ENCOUNTER — Ambulatory Visit (INDEPENDENT_AMBULATORY_CARE_PROVIDER_SITE_OTHER): Payer: Commercial Managed Care - PPO

## 2022-04-18 DIAGNOSIS — I442 Atrioventricular block, complete: Secondary | ICD-10-CM

## 2022-04-18 LAB — CUP PACEART REMOTE DEVICE CHECK
Battery Remaining Longevity: 129 mo
Battery Voltage: 3.03 V
Brady Statistic AP VP Percent: 2.82 %
Brady Statistic AP VS Percent: 0.01 %
Brady Statistic AS VP Percent: 94.29 %
Brady Statistic AS VS Percent: 2.88 %
Brady Statistic RA Percent Paced: 2.79 %
Brady Statistic RV Percent Paced: 97.11 %
Date Time Interrogation Session: 20230503041230
Implantable Lead Implant Date: 20211029
Implantable Lead Implant Date: 20211029
Implantable Lead Location: 753859
Implantable Lead Location: 753860
Implantable Lead Model: 5076
Implantable Lead Model: 5076
Implantable Pulse Generator Implant Date: 20211029
Lead Channel Impedance Value: 285 Ohm
Lead Channel Impedance Value: 342 Ohm
Lead Channel Impedance Value: 361 Ohm
Lead Channel Impedance Value: 418 Ohm
Lead Channel Pacing Threshold Amplitude: 0.5 V
Lead Channel Pacing Threshold Amplitude: 0.625 V
Lead Channel Pacing Threshold Pulse Width: 0.4 ms
Lead Channel Pacing Threshold Pulse Width: 0.4 ms
Lead Channel Sensing Intrinsic Amplitude: 3.375 mV
Lead Channel Sensing Intrinsic Amplitude: 3.375 mV
Lead Channel Sensing Intrinsic Amplitude: 4 mV
Lead Channel Sensing Intrinsic Amplitude: 4 mV
Lead Channel Setting Pacing Amplitude: 1.5 V
Lead Channel Setting Pacing Amplitude: 2 V
Lead Channel Setting Pacing Pulse Width: 0.4 ms
Lead Channel Setting Sensing Sensitivity: 1.2 mV

## 2022-05-02 NOTE — Progress Notes (Signed)
Remote pacemaker transmission.   

## 2022-05-18 ENCOUNTER — Other Ambulatory Visit: Payer: Self-pay | Admitting: Cardiology

## 2022-05-18 NOTE — Telephone Encounter (Signed)
Rx refill sent to pharmacy. 

## 2022-05-30 ENCOUNTER — Other Ambulatory Visit: Payer: Self-pay | Admitting: Cardiology

## 2022-07-18 ENCOUNTER — Ambulatory Visit (INDEPENDENT_AMBULATORY_CARE_PROVIDER_SITE_OTHER): Payer: Commercial Managed Care - PPO

## 2022-07-18 DIAGNOSIS — I442 Atrioventricular block, complete: Secondary | ICD-10-CM | POA: Diagnosis not present

## 2022-07-18 LAB — CUP PACEART REMOTE DEVICE CHECK
Battery Remaining Longevity: 126 mo
Battery Voltage: 3.02 V
Brady Statistic AP VP Percent: 2.78 %
Brady Statistic AP VS Percent: 0.01 %
Brady Statistic AS VP Percent: 95.51 %
Brady Statistic AS VS Percent: 1.71 %
Brady Statistic RA Percent Paced: 3.01 %
Brady Statistic RV Percent Paced: 98.28 %
Date Time Interrogation Session: 20230802041305
Implantable Lead Implant Date: 20211029
Implantable Lead Implant Date: 20211029
Implantable Lead Location: 753859
Implantable Lead Location: 753860
Implantable Lead Model: 5076
Implantable Lead Model: 5076
Implantable Pulse Generator Implant Date: 20211029
Lead Channel Impedance Value: 266 Ohm
Lead Channel Impedance Value: 285 Ohm
Lead Channel Impedance Value: 361 Ohm
Lead Channel Impedance Value: 418 Ohm
Lead Channel Pacing Threshold Amplitude: 0.5 V
Lead Channel Pacing Threshold Amplitude: 0.625 V
Lead Channel Pacing Threshold Pulse Width: 0.4 ms
Lead Channel Pacing Threshold Pulse Width: 0.4 ms
Lead Channel Sensing Intrinsic Amplitude: 2.625 mV
Lead Channel Sensing Intrinsic Amplitude: 2.625 mV
Lead Channel Sensing Intrinsic Amplitude: 4.25 mV
Lead Channel Sensing Intrinsic Amplitude: 4.25 mV
Lead Channel Setting Pacing Amplitude: 1.5 V
Lead Channel Setting Pacing Amplitude: 2 V
Lead Channel Setting Pacing Pulse Width: 0.4 ms
Lead Channel Setting Sensing Sensitivity: 1.2 mV

## 2022-08-08 ENCOUNTER — Telehealth: Payer: Self-pay | Admitting: *Deleted

## 2022-08-08 NOTE — Telephone Encounter (Signed)
Informed pt that we received home sleep study results from [facility] medical services. Advised that Dr. Elberta Fortis reviewed and recommended she establish with someone on our sleep study team. Pt reports that she is not interested at this time d/t financial issues/concerns. Aware I will inform Dr. Elberta Fortis and will re-address at next OV later this year. Patient verbalized understanding and agreeable to plan.

## 2022-08-10 NOTE — Progress Notes (Signed)
Remote pacemaker transmission.   

## 2022-10-01 ENCOUNTER — Ambulatory Visit: Payer: Commercial Managed Care - PPO | Attending: Cardiology | Admitting: Cardiology

## 2022-10-01 ENCOUNTER — Encounter: Payer: Self-pay | Admitting: Cardiology

## 2022-10-01 VITALS — BP 144/72 | HR 82 | Ht 60.0 in | Wt 240.0 lb

## 2022-10-01 DIAGNOSIS — I10 Essential (primary) hypertension: Secondary | ICD-10-CM

## 2022-10-01 DIAGNOSIS — R0600 Dyspnea, unspecified: Secondary | ICD-10-CM

## 2022-10-01 DIAGNOSIS — R0609 Other forms of dyspnea: Secondary | ICD-10-CM

## 2022-10-01 DIAGNOSIS — I442 Atrioventricular block, complete: Secondary | ICD-10-CM | POA: Diagnosis not present

## 2022-10-01 DIAGNOSIS — R5383 Other fatigue: Secondary | ICD-10-CM

## 2022-10-01 DIAGNOSIS — Z950 Presence of cardiac pacemaker: Secondary | ICD-10-CM | POA: Diagnosis not present

## 2022-10-01 DIAGNOSIS — R001 Bradycardia, unspecified: Secondary | ICD-10-CM | POA: Diagnosis not present

## 2022-10-01 NOTE — Patient Instructions (Addendum)
Medication Instructions:  Your physician recommends that you continue on your current medications as directed. Please refer to the Current Medication list given to you today.  *If you need a refill on your cardiac medications before your next appointment, please call your pharmacy*   Lab Work: CMP, Lipid, TSH, A1C, CBC If you have labs (blood work) drawn today and your tests are completely normal, you will receive your results only by: [place] (if you have MyChart) OR A paper copy in the mail If you have any lab test that is abnormal or we need to change your treatment, we will call you to review the results.   Testing/Procedures: None Ordered   Follow-Up: At [facility], you and your health needs are our priority.  As part of our continuing mission to provide you with exceptional heart care, we have created designated Provider Care Teams.  These Care Teams include your primary Cardiologist (physician) and Advanced Practice Providers (APPs -  Physician Assistants and Nurse Practitioners) who all work together to provide you with the care you need, when you need it.  We recommend signing up for the patient portal called "MyChart".  Sign up information is provided on this After Visit Summary.  MyChart is used to connect with patients for Virtual Visits (Telemedicine).  Patients are able to view lab/test results, encounter notes, upcoming appointments, etc.  Non-urgent messages can be sent to your provider as well.   To learn more about what you can do with MyChart, go to [email or web address].    Your next appointment:   6 month(s)  The format for your next appointment:   In Person  Provider:   Jenne Campus, MD    Other Instructions NA

## 2022-10-01 NOTE — Progress Notes (Signed)
Cardiology Office Note:    Date:  10/01/2022   ID:  Cynthia Archer, DOB September 10, 1961, MRN [id number]  PCP:  Rhea Bleacher, NP  Cardiologist:  Jenne Campus, MD    Referring MD: Rhea Bleacher, NP   Chief Complaint  Patient presents with   Follow-up  Doing very well  History of Present Illness:    Cynthia Archer is a 61 y.o. female with past medical history significant for diastolic congestive heart for, essential hypertension, heart block requiring pacemaker which was implanted in October 2021, that is Medtronic device, dyslipidemia, essential hypertension, chronic swelling of lower extremities. She comes to my office today for follow-up overall she is doing well.  She denies have any chest pain tightness squeezing pressure mid chest.  Swelling of lower extremity still there but only mild.  Does not bother her much.  Overall she seems to be doing well  Past Medical History:  Diagnosis Date   CHB (complete heart block) ([place]) 10/15/2020   Degeneration of lumbar intervertebral disc 02/08/2020   Essential hypertension 02/26/2017   Hypercalcemia 02/26/2017   Hyperparathyroidism ([place]) 02/26/2017   S/P placement of cardiac pacemaker, medtronic 10/14/20 10/15/2020   Second degree AV block 10/14/2020   Symptomatic bradycardia 10/15/2020   Thyroid disease    Vitamin D deficiency 02/26/2017    Past Surgical History:  Procedure Laterality Date   ANKLE SURGERY     PACEMAKER IMPLANT N/A 10/14/2020   Procedure: PACEMAKER IMPLANT;  Surgeon: Constance Haw, MD;  Location: [place] [facility];  Service: Cardiovascular;  Laterality: N/A;   THYROID SURGERY     TUBAL LIGATION      Current Medications: Current Meds  Medication Sig   acetaminophen (TYLENOL) 650 MG CR tablet Take 650 mg by mouth every 8 (eight) hours as needed for pain.   amLODipine (NORVASC) 10 MG tablet TAKE 1 TABLET BY MOUTH EVERY DAY (Patient taking differently: Take 10 mg by mouth daily.)   furosemide (LASIX) 40 MG  tablet TAKE 1 TABLET BY MOUTH EVERY DAY   hydrALAZINE (APRESOLINE) 50 MG tablet Take 1 tablet (50 mg total) by mouth in the morning and at bedtime. (Patient taking differently: Take 50 mg by mouth daily.)   levothyroxine (SYNTHROID) 50 MCG tablet Take 50 mcg by mouth daily at 6 (six) AM.    naproxen sodium (ALEVE) 220 MG tablet Take 440 mg by mouth 2 (two) times daily with a meal.   nitroGLYCERIN (NITROSTAT) 0.4 MG SL tablet Place 1 tablet (0.4 mg total) under the tongue every 5 (five) minutes as needed for chest pain.   omeprazole (PRILOSEC) 40 MG capsule Take 40 mg by mouth daily at 6 (six) AM.    potassium chloride SA (KLOR-CON) 20 MEQ tablet Take 20 mEq by mouth daily.   rosuvastatin (CRESTOR) 10 MG tablet Take 10 mg by mouth daily at 6 (six) AM.     Allergies:   Patient has no known allergies.   Social History   Socioeconomic History   Marital status: Married    Spouse name: Not on file   Number of children: Not on file   Years of education: Not on file   Highest education level: Not on file  Occupational History   Not on file  Tobacco Use   Smoking status: Never   Smokeless tobacco: Never  Substance and Sexual Activity   Alcohol use: Not Currently   Drug use: Never   Sexual activity: Not on file  Other Topics  Concern   Not on file  Social History Narrative   Not on file   Social Determinants of Health   Financial Resource Strain: Not on file  Food Insecurity: Not on file  Transportation Needs: Not on file  Physical Activity: Not on file  Stress: Not on file  Social Connections: Not on file     Family History: The patient's family history includes Alzheimer's disease in her maternal grandmother; COPD in her father; Heart disease in her mother; Kidney failure in her mother; Lung cancer in her father, maternal grandfather, and paternal grandfather. ROS:   Please see the history of present illness.    All 14 point review of systems negative except as described per  history of present illness  EKGs/Labs/Other Studies Reviewed:      Recent Labs: 03/27/2022: BUN 33; Creatinine, Ser 1.36; Potassium 3.9; Sodium 140  Recent Lipid Panel No results found for: "CHOL", "TRIG", "HDL", "CHOLHDL", "VLDL", "LDLCALC", "LDLDIRECT"  Physical Exam:    VS:  BP (!) 144/72   Pulse 82   Ht 5' (1.524 m)   Wt 240 lb (108.9 kg)   SpO2 96%   BMI 46.87 kg/m     Wt Readings from Last 3 Encounters:  10/01/22 240 lb (108.9 kg)  03/27/22 236 lb 9.6 oz (107.3 kg)  10/30/21 240 lb (108.9 kg)     GEN:  Well nourished, well developed in no acute distress HEENT: Normal NECK: No JVD; No carotid bruits LYMPHATICS: No lymphadenopathy CARDIAC: RRR, no murmurs, no rubs, no gallops RESPIRATORY:  Clear to auscultation without rales, wheezing or rhonchi  ABDOMEN: Soft, non-tender, non-distended MUSCULOSKELETAL:  No edema; No deformity  SKIN: Warm and dry LOWER EXTREMITIES: no swelling NEUROLOGIC:  Alert and oriented x 3 PSYCHIATRIC:  Normal affect   ASSESSMENT:    1. Essential hypertension   2. CHB (complete heart block) (HCC)   3. Symptomatic bradycardia   4. S/P placement of cardiac pacemaker, medtronic 10/14/20   5. Dyspnea on exertion    PLAN:    In order of problems listed above:  Essential hypertension uncontrolled however I did notice today that she takes hydralazine 50 mg daily I asked her to split hydralazine one half take 1 tablet in the morning 1 tablet at evening time.  That should help with the blood pressure I will check her blood pressure myself which was 140/80 Complete heart block that being addressed with pacemaker Pacemaker presence of Medtronic device I did review interrogation 10.5 years left in device normal parameters, Dyspnea exertion multifactorial but overall is getting slightly better encouraged her to be a little more active.  Echocardiogram reviewed from last time which showed some diastolic dysfunction with preserved left ventricle  ejection fraction. Dyslipidemia I do not have any recent data I will do fasting lipid profile today    Medication Adjustments/Labs and Tests Ordered: Current medicines are reviewed at length with the patient today.  Concerns regarding medicines are outlined above.  No orders of the defined types were placed in this encounter.  Medication changes: No orders of the defined types were placed in this encounter.   Signed, Georgeanna Lea, MD, [facility] 10/01/2022 9:19 AM    [facility]

## 2022-10-01 NOTE — Addendum Note (Signed)
Addended by: Jacobo Forest D on: 10/01/2022 09:31 AM   Modules accepted: Orders

## 2022-10-02 LAB — LIPID PANEL
Chol/HDL Ratio: 2.9 ratio (ref 0.0–4.4)
Cholesterol, Total: 150 mg/dL (ref 100–199)
HDL: 52 mg/dL (ref >39)
LDL Chol Calc (NIH): 78 mg/dL (ref 0–99)
Triglycerides: 109 mg/dL (ref 0–149)
VLDL Cholesterol Cal: 20 mg/dL (ref 5–40)

## 2022-10-02 LAB — CBC
Hematocrit: 40.1 % (ref 34.0–46.6)
Hemoglobin: 12.9 g/dL (ref 11.1–15.9)
MCH: 28.2 pg (ref 26.6–33.0)
MCHC: 32.2 g/dL (ref 31.5–35.7)
MCV: 88 fL (ref 79–97)
Platelets: 291 10*3/uL (ref 150–450)
RBC: 4.57 x10E6/uL (ref 3.77–5.28)
RDW: 14.7 % (ref 11.7–15.4)
WBC: 8 10*3/uL (ref 3.4–10.8)

## 2022-10-02 LAB — COMPREHENSIVE METABOLIC PANEL
ALT: 14 IU/L (ref 0–32)
AST: 17 IU/L (ref 0–40)
Albumin/Globulin Ratio: 1.6 (ref 1.2–2.2)
Albumin: 4.5 g/dL (ref 3.9–4.9)
Alkaline Phosphatase: 110 IU/L (ref 44–121)
BUN/Creatinine Ratio: 23 (ref 12–28)
BUN: 28 mg/dL — ABNORMAL HIGH (ref 8–27)
Bilirubin Total: 0.2 mg/dL (ref 0.0–1.2)
CO2: 24 mmol/L (ref 20–29)
Calcium: 10.4 mg/dL — ABNORMAL HIGH (ref 8.7–10.3)
Chloride: 103 mmol/L (ref 96–106)
Creatinine, Ser: 1.23 mg/dL — ABNORMAL HIGH (ref 0.57–1.00)
Globulin, Total: 2.9 g/dL (ref 1.5–4.5)
Glucose: 111 mg/dL — ABNORMAL HIGH (ref 70–99)
Potassium: 4.2 mmol/L (ref 3.5–5.2)
Sodium: 142 mmol/L (ref 134–144)
Total Protein: 7.4 g/dL (ref 6.0–8.5)
eGFR: 50 mL/min/{1.73_m2} — ABNORMAL LOW (ref >59)

## 2022-10-02 LAB — HEMOGLOBIN A1C
Est. average glucose Bld gHb Est-mCnc: 123 mg/dL
Hgb A1c MFr Bld: 5.9 % — ABNORMAL HIGH (ref 4.8–5.6)

## 2022-10-02 LAB — TSH: TSH: 5.39 u[IU]/mL — ABNORMAL HIGH (ref 0.450–4.500)

## 2022-10-08 ENCOUNTER — Telehealth: Payer: Self-pay

## 2022-10-08 NOTE — Telephone Encounter (Signed)
-----   Message from Park Liter, MD sent at 10/04/2022  8:19 PM EDT ----- Labs are looking good except for hypothyroidism, she need to see her primary care physician to make adjustment for her medications

## 2022-10-08 NOTE — Telephone Encounter (Signed)
Patient notified of results, results sent to PCP ?

## 2022-10-17 ENCOUNTER — Ambulatory Visit (INDEPENDENT_AMBULATORY_CARE_PROVIDER_SITE_OTHER): Payer: Commercial Managed Care - PPO

## 2022-10-17 DIAGNOSIS — I442 Atrioventricular block, complete: Secondary | ICD-10-CM

## 2022-10-17 LAB — CUP PACEART REMOTE DEVICE CHECK
Battery Remaining Longevity: 123 mo
Battery Voltage: 3.02 V
Brady Statistic AP VP Percent: 2.88 %
Brady Statistic AP VS Percent: 0.01 %
Brady Statistic AS VP Percent: 96.12 %
Brady Statistic AS VS Percent: 1 %
Brady Statistic RA Percent Paced: 2.92 %
Brady Statistic RV Percent Paced: 99 %
Date Time Interrogation Session: 20231031224115
Implantable Lead Connection Status: 753985
Implantable Lead Connection Status: 753985
Implantable Lead Implant Date: 20211029
Implantable Lead Implant Date: 20211029
Implantable Lead Location: 753859
Implantable Lead Location: 753860
Implantable Lead Model: 5076
Implantable Lead Model: 5076
Implantable Pulse Generator Implant Date: 20211029
Lead Channel Impedance Value: 285 Ohm
Lead Channel Impedance Value: 342 Ohm
Lead Channel Impedance Value: 361 Ohm
Lead Channel Impedance Value: 418 Ohm
Lead Channel Pacing Threshold Amplitude: 0.5 V
Lead Channel Pacing Threshold Amplitude: 0.625 V
Lead Channel Pacing Threshold Pulse Width: 0.4 ms
Lead Channel Pacing Threshold Pulse Width: 0.4 ms
Lead Channel Sensing Intrinsic Amplitude: 11.75 mV
Lead Channel Sensing Intrinsic Amplitude: 11.75 mV
Lead Channel Sensing Intrinsic Amplitude: 3.25 mV
Lead Channel Sensing Intrinsic Amplitude: 3.25 mV
Lead Channel Setting Pacing Amplitude: 1.5 V
Lead Channel Setting Pacing Amplitude: 2 V
Lead Channel Setting Pacing Pulse Width: 0.4 ms
Lead Channel Setting Sensing Sensitivity: 1.2 mV
Zone Setting Status: 755011
Zone Setting Status: 755011

## 2022-10-30 NOTE — Progress Notes (Signed)
Remote pacemaker transmission.   

## 2023-01-16 ENCOUNTER — Ambulatory Visit: Payer: Commercial Managed Care - PPO

## 2023-01-16 DIAGNOSIS — I442 Atrioventricular block, complete: Secondary | ICD-10-CM | POA: Diagnosis not present

## 2023-01-16 LAB — CUP PACEART REMOTE DEVICE CHECK
Battery Remaining Longevity: 120 mo
Battery Voltage: 3.02 V
Brady Statistic AP VP Percent: 7.68 %
Brady Statistic AP VS Percent: 0.07 %
Brady Statistic AS VP Percent: 91.83 %
Brady Statistic AS VS Percent: 0.42 %
Brady Statistic RA Percent Paced: 7.72 %
Brady Statistic RV Percent Paced: 99.51 %
Date Time Interrogation Session: 20240131031401
Implantable Lead Connection Status: 753985
Implantable Lead Connection Status: 753985
Implantable Lead Implant Date: 20211029
Implantable Lead Implant Date: 20211029
Implantable Lead Location: 753859
Implantable Lead Location: 753860
Implantable Lead Model: 5076
Implantable Lead Model: 5076
Implantable Pulse Generator Implant Date: 20211029
Lead Channel Impedance Value: 304 Ohm
Lead Channel Impedance Value: 342 Ohm
Lead Channel Impedance Value: 342 Ohm
Lead Channel Impedance Value: 418 Ohm
Lead Channel Pacing Threshold Amplitude: 0.5 V
Lead Channel Pacing Threshold Amplitude: 0.625 V
Lead Channel Pacing Threshold Pulse Width: 0.4 ms
Lead Channel Pacing Threshold Pulse Width: 0.4 ms
Lead Channel Sensing Intrinsic Amplitude: 14 mV
Lead Channel Sensing Intrinsic Amplitude: 14 mV
Lead Channel Sensing Intrinsic Amplitude: 2.625 mV
Lead Channel Sensing Intrinsic Amplitude: 2.625 mV
Lead Channel Setting Pacing Amplitude: 1.5 V
Lead Channel Setting Pacing Amplitude: 2 V
Lead Channel Setting Pacing Pulse Width: 0.4 ms
Lead Channel Setting Sensing Sensitivity: 1.2 mV
Zone Setting Status: 755011
Zone Setting Status: 755011

## 2023-02-08 NOTE — Progress Notes (Signed)
Remote pacemaker transmission.   

## 2023-02-18 ENCOUNTER — Ambulatory Visit: Payer: Commercial Managed Care - PPO | Attending: Cardiology | Admitting: Cardiology

## 2023-02-18 ENCOUNTER — Encounter: Payer: Self-pay | Admitting: Cardiology

## 2023-02-18 VITALS — BP 150/74 | HR 108 | Ht 60.0 in | Wt 235.0 lb

## 2023-02-18 DIAGNOSIS — I441 Atrioventricular block, second degree: Secondary | ICD-10-CM | POA: Diagnosis not present

## 2023-02-18 DIAGNOSIS — I10 Essential (primary) hypertension: Secondary | ICD-10-CM

## 2023-02-18 DIAGNOSIS — G4733 Obstructive sleep apnea (adult) (pediatric): Secondary | ICD-10-CM | POA: Diagnosis not present

## 2023-02-18 LAB — CUP PACEART INCLINIC DEVICE CHECK
Battery Remaining Longevity: 121 mo
Battery Voltage: 3.02 V
Brady Statistic AP VP Percent: 4.73 %
Brady Statistic AP VS Percent: 0.02 %
Brady Statistic AS VP Percent: 93.34 %
Brady Statistic AS VS Percent: 1.91 %
Brady Statistic RA Percent Paced: 4.78 %
Brady Statistic RV Percent Paced: 98.07 %
Date Time Interrogation Session: 20240304171205
Implantable Lead Connection Status: 753985
Implantable Lead Connection Status: 753985
Implantable Lead Implant Date: 20211029
Implantable Lead Implant Date: 20211029
Implantable Lead Location: 753859
Implantable Lead Location: 753860
Implantable Lead Model: 5076
Implantable Lead Model: 5076
Implantable Pulse Generator Implant Date: 20211029
Lead Channel Impedance Value: 323 Ohm
Lead Channel Impedance Value: 361 Ohm
Lead Channel Impedance Value: 380 Ohm
Lead Channel Impedance Value: 456 Ohm
Lead Channel Pacing Threshold Amplitude: 0.625 V
Lead Channel Pacing Threshold Amplitude: 0.625 V
Lead Channel Pacing Threshold Pulse Width: 0.4 ms
Lead Channel Pacing Threshold Pulse Width: 0.4 ms
Lead Channel Sensing Intrinsic Amplitude: 1.5 mV
Lead Channel Sensing Intrinsic Amplitude: 14 mV
Lead Channel Sensing Intrinsic Amplitude: 14 mV
Lead Channel Sensing Intrinsic Amplitude: 4 mV
Lead Channel Setting Pacing Amplitude: 1.5 V
Lead Channel Setting Pacing Amplitude: 2 V
Lead Channel Setting Pacing Pulse Width: 0.4 ms
Lead Channel Setting Sensing Sensitivity: 1.2 mV
Zone Setting Status: 755011
Zone Setting Status: 755011

## 2023-02-18 NOTE — Progress Notes (Signed)
Electrophysiology Office Note   Date:  02/18/2023   ID:  Cynthia Archer, DOB June 14, 1961, MRN [id number]  PCP:  Rhea Bleacher, NP  Cardiologist:  [place] Primary Electrophysiologist:  Layton Tappan Meredith Leeds, MD    Chief Complaint: pacemaker   History of Present Illness: Cynthia Archer is a 62 y.o. female who is being seen today for the evaluation of pacemaker at the request of Rhea Bleacher, NP. Presenting today for electrophysiology evaluation.  He has a history significant for hypertension, hypothyroidism, obesity, Mobitz 1 AV block.  She presented to the hospital in 2021 with second-degree AV block.  She is post Medtronic dual-chamber pacemaker implanted 10/14/2020.  Today, denies symptoms of palpitations, chest pain, shortness of breath, orthopnea, PND, lower extremity edema, claudication, dizziness, presyncope, syncope, bleeding, or neurologic sequela. The patient is tolerating medications without difficulties.  She currently feels well.  She has no major complaints.  She has been diagnosed with sleep apnea.  She was supposed to come into the hospital to have her CPAP titrated, but she does not want to pay for hospital stay.  Aside from that she has no complaints.   Past Medical History:  Diagnosis Date   CHB (complete heart block) ([place]) 10/15/2020   Degeneration of lumbar intervertebral disc 02/08/2020   Essential hypertension 02/26/2017   Hypercalcemia 02/26/2017   Hyperparathyroidism ([place]) 02/26/2017   S/P placement of cardiac pacemaker, medtronic 10/14/20 10/15/2020   Second degree AV block 10/14/2020   Symptomatic bradycardia 10/15/2020   Thyroid disease    Vitamin D deficiency 02/26/2017   Past Surgical History:  Procedure Laterality Date   ANKLE SURGERY     PACEMAKER IMPLANT N/A 10/14/2020   Procedure: PACEMAKER IMPLANT;  Surgeon: Constance Haw, MD;  Location: [facility];  Service: Cardiovascular;  Laterality: N/A;   THYROID SURGERY     TUBAL LIGATION        Current Outpatient Medications  Medication Sig Dispense Refill   acetaminophen (TYLENOL) 650 MG CR tablet Take 650 mg by mouth every 8 (eight) hours as needed for pain.     allopurinol (ZYLOPRIM) 300 MG tablet Take 300 mg by mouth daily.     amLODipine (NORVASC) 10 MG tablet TAKE 1 TABLET BY MOUTH EVERY DAY (Patient taking differently: Take 10 mg by mouth daily.) 90 tablet 3   furosemide (LASIX) 40 MG tablet TAKE 1 TABLET BY MOUTH EVERY DAY 90 tablet 3   levothyroxine (SYNTHROID) 50 MCG tablet Take 50 mcg by mouth daily at 6 (six) AM.      naproxen sodium (ALEVE) 220 MG tablet Take 440 mg by mouth 2 (two) times daily with a meal.     omeprazole (PRILOSEC) 40 MG capsule Take 40 mg by mouth daily at 6 (six) AM.      potassium chloride SA (KLOR-CON) 20 MEQ tablet Take 20 mEq by mouth daily.     rosuvastatin (CRESTOR) 10 MG tablet Take 10 mg by mouth daily at 6 (six) AM.     hydrALAZINE (APRESOLINE) 50 MG tablet Take 1 tablet (50 mg total) by mouth in the morning and at bedtime. (Patient taking differently: Take 50 mg by mouth daily.) 180 tablet 3   nitroGLYCERIN (NITROSTAT) 0.4 MG SL tablet Place 1 tablet (0.4 mg total) under the tongue every 5 (five) minutes as needed for chest pain. 25 tablet 11   No current facility-administered medications for this visit.    Allergies:   Patient has no known allergies.  Social History:  The patient  reports that she has never smoked. She has never used smokeless tobacco. She reports that she does not currently use alcohol. She reports that she does not use drugs.   Family History:  The patient's family history includes Alzheimer's disease in her maternal grandmother; COPD in her father; Heart disease in her mother; Kidney failure in her mother; Lung cancer in her father, maternal grandfather, and paternal grandfather.   ROS:  Please see the history of present illness.   Otherwise, review of systems is positive for none.   All other systems are  reviewed and negative.   PHYSICAL EXAM: VS:  BP (!) 150/74   Pulse (!) 108   Ht 5' (1.524 m)   Wt 235 lb (106.6 kg)   SpO2 95%   BMI 45.90 kg/m  , BMI Body mass index is 45.9 kg/m. GEN: Well nourished, well developed, in no acute distress  HEENT: normal  Neck: no JVD, carotid bruits, or masses Cardiac: RRR; no murmurs, rubs, or gallops,no edema  Respiratory:  clear to auscultation bilaterally, normal work of breathing GI: soft, nontender, nondistended, + BS MS: no deformity or atrophy  Skin: warm and dry, device site well healed Neuro:  Strength and sensation are intact Psych: euthymic mood, full affect  EKG:  EKG is ordered today. Personal review of the ekg ordered shows sinus rhythm, V paced  Personal review of the device interrogation today. Results in [place]: 10/01/2022: ALT 14; BUN 28; Creatinine, Ser 1.23; Hemoglobin 12.9; Platelets 291; Potassium 4.2; Sodium 142; TSH 5.390    Lipid Panel     Component Value Date/Time   CHOL 150 10/01/2022 0932   TRIG 109 10/01/2022 0932   HDL 52 10/01/2022 0932   CHOLHDL 2.9 10/01/2022 0932   LDLCALC 78 10/01/2022 0932     Wt Readings from Last 3 Encounters:  02/18/23 235 lb (106.6 kg)  10/01/22 240 lb (108.9 kg)  03/27/22 236 lb 9.6 oz (107.3 kg)      Other studies Reviewed: Additional studies/ records that were reviewed today include: TTE 09/27/20  Review of the above records today demonstrates:  EF 60 to 123456 Grade 1 diastolic dysfunction Right ventricle normal size and function Left atrium normal in size Trace to mild mitral regurgitation   ASSESSMENT AND PLAN:  1.  Second-degree AV block: Status post Medtronic dual-chamber pacemaker implanted 10/14/2020.  Device functioning appropriately.  No changes.  2.  Hypertension: Elevated today.  Mildly elevated today.  Usually well-controlled at home.  Is elevated when she comes to the doctor.  No changes.  3.  Chronic diastolic heart failure: No  obvious volume overload  4.  Obstructive sleep apnea: CPAP compliance encouraged.  Patient does not wish to travel to [facility] to have her sleep apnea treated.  Renaud Celli allow her primary physician to refer her to a sleep specialist in [place].    Current medicines are reviewed at length with the patient today.   The patient does not have concerns regarding her medicines.  The following changes were made today: none  Labs/ tests ordered today include:  Orders Placed This Encounter  Procedures   EKG 12-Lead      Disposition:   FU 12 months  Signed, Keaja Reaume Meredith Leeds, MD  02/18/2023 4:49 PM     [facility] [address] [place] [address] [phone] (office) [phone] (fax)

## 2023-02-18 NOTE — Patient Instructions (Addendum)
Medication Instructions:  Your physician recommends that you continue on your current medications as directed. Please refer to the Current Medication list given to you today.  *If you need a refill on your cardiac medications before your next appointment, please call your pharmacy*   Lab Work: None ordered If you have labs (blood work) drawn today and your tests are completely normal, you will receive your results only by: [place] (if you have MyChart) OR A paper copy in the mail If you have any lab test that is abnormal or we need to change your treatment, we will call you to review the results.   Testing/Procedures: None ordered   Follow-Up: At [facility], you and your health needs are our priority.  As part of our continuing mission to provide you with exceptional heart care, we have created designated Provider Care Teams.  These Care Teams include your primary Cardiologist (physician) and Advanced Practice Providers (APPs -  Physician Assistants and Nurse Practitioners) who all work together to provide you with the care you need, when you need it.  We recommend signing up for the patient portal called "MyChart".  Sign up information is provided on this After Visit Summary.  MyChart is used to connect with patients for Virtual Visits (Telemedicine).  Patients are able to view lab/test results, encounter notes, upcoming appointments, etc.  Non-urgent messages can be sent to your provider as well.   To learn more about what you can do with MyChart, go to [email or web address].    Remote monitoring is used to monitor your Pacemaker or ICD from home. This monitoring reduces the number of office visits required to check your device to one time per year. It allows [place] to keep an eye on the functioning of your device to ensure it is working properly. You are scheduled for a device check from home on 04/17/2023. You may send your transmission at any time that day. If you have a  wireless device, the transmission will be sent automatically. After your physician reviews your transmission, you will receive a postcard with your next transmission date.  Your next appointment:   1 year(s)  The format for your next appointment:   In Person  Provider:   Allegra Lai, MD    Thank you for choosing [facility]!!   Trinidad Curet, RN [phone]    Other Instructions  Please speak with your primary doctor about getting you established with a sleep physician in [place]

## 2023-04-03 ENCOUNTER — Ambulatory Visit: Payer: No Typology Code available for payment source | Attending: Cardiology | Admitting: Cardiology

## 2023-04-03 ENCOUNTER — Encounter: Payer: Self-pay | Admitting: Cardiology

## 2023-04-03 VITALS — BP 142/70 | HR 93 | Ht 60.0 in | Wt 236.4 lb

## 2023-04-03 DIAGNOSIS — Z950 Presence of cardiac pacemaker: Secondary | ICD-10-CM

## 2023-04-03 DIAGNOSIS — R0609 Other forms of dyspnea: Secondary | ICD-10-CM | POA: Diagnosis not present

## 2023-04-03 DIAGNOSIS — I442 Atrioventricular block, complete: Secondary | ICD-10-CM | POA: Diagnosis not present

## 2023-04-03 DIAGNOSIS — I10 Essential (primary) hypertension: Secondary | ICD-10-CM | POA: Diagnosis not present

## 2023-04-03 MED ORDER — HYDRALAZINE HCL 50 MG PO TABS: 50.0000 mg | ORAL_TABLET | Freq: Three times a day (TID) | ORAL | 3 refills | Status: DC

## 2023-04-03 NOTE — Patient Instructions (Addendum)
Medication Instructions:   INCREASE: Hydralazine  1 tablet tid   Lab Work: None Ordered If you have labs (blood work) drawn today and your tests are completely normal, you will receive your results only by: MyChart Message (if you have MyChart) OR A paper copy in the mail If you have any lab test that is abnormal or we need to change your treatment, we will call you to review the results.   Testing/Procedures: None Ordered   Follow-Up: At [facility], you and your health needs are our priority.  As part of our continuing mission to provide you with exceptional heart care, we have created designated Provider Care Teams.  These Care Teams include your primary Cardiologist (physician) and Advanced Practice Providers (APPs -  Physician Assistants and Nurse Practitioners) who all work together to provide you with the care you need, when you need it.  We recommend signing up for the patient portal called "MyChart".  Sign up information is provided on this After Visit Summary.  MyChart is used to connect with patients for Virtual Visits (Telemedicine).  Patients are able to view lab/test results, encounter notes, upcoming appointments, etc.  Non-urgent messages can be sent to your provider as well.   To learn more about what you can do with MyChart, go to [email or web address].    Your next appointment:   6 month(s)  The format for your next appointment:   In Person  Provider:   Gypsy Balsam, MD    Other Instructions NA

## 2023-04-03 NOTE — Progress Notes (Signed)
Cardiology Office Note:    Date:  04/03/2023   ID:  Cynthia Archer, DOB 09/27/1961, MRN [id number]  PCP:  Erskine Emery, NP  Cardiologist:  Gypsy Balsam, MD    Referring MD: Erskine Emery, NP   Chief Complaint  Patient presents with   Follow-up  Doing fine  History of Present Illness:    Cynthia Archer is a 62 y.o. female past medical history significant for diastolic congestive heart failure, essential hypertension, complete heart block required pacemaker which is implanted in 2021 Medtronic device, dyslipidemia, essential hypertension, chronic swelling of lower extremities. Comes today to months for follow-up.  Overall doing quite well.  Denies have any chest pain tightness squeezing pressure burning chest no palpitations dizziness swelling of lower extremities.  Past Medical History:  Diagnosis Date   CHB (complete heart block) 10/15/2020   Degeneration of lumbar intervertebral disc 02/08/2020   Essential hypertension 02/26/2017   Hypercalcemia 02/26/2017   Hyperparathyroidism 02/26/2017   S/P placement of cardiac pacemaker, medtronic 10/14/20 10/15/2020   Second degree AV block 10/14/2020   Symptomatic bradycardia 10/15/2020   Thyroid disease    Vitamin D deficiency 02/26/2017    Past Surgical History:  Procedure Laterality Date   ANKLE SURGERY     PACEMAKER IMPLANT N/A 10/14/2020   Procedure: PACEMAKER IMPLANT;  Surgeon: Regan Lemming, MD;  Location: [facility];  Service: Cardiovascular;  Laterality: N/A;   THYROID SURGERY     TUBAL LIGATION      Current Medications: Current Meds  Medication Sig   acetaminophen (TYLENOL) 650 MG CR tablet Take 650 mg by mouth every 8 (eight) hours as needed for pain.   allopurinol (ZYLOPRIM) 300 MG tablet Take 300 mg by mouth daily.   amLODipine (NORVASC) 10 MG tablet TAKE 1 TABLET BY MOUTH EVERY DAY (Patient taking differently: Take 10 mg by mouth daily.)   furosemide (LASIX) 40 MG tablet TAKE 1 TABLET BY MOUTH  EVERY DAY (Patient taking differently: Take 40 mg by mouth daily.)   hydrALAZINE (APRESOLINE) 50 MG tablet Take 1 tablet (50 mg total) by mouth in the morning and at bedtime. (Patient taking differently: Take 50 mg by mouth daily.)   levothyroxine (SYNTHROID) 50 MCG tablet Take 50 mcg by mouth daily at 6 (six) AM.    naproxen sodium (ALEVE) 220 MG tablet Take 440 mg by mouth 2 (two) times daily with a meal.   nitroGLYCERIN (NITROSTAT) 0.4 MG SL tablet Place 1 tablet (0.4 mg total) under the tongue every 5 (five) minutes as needed for chest pain.   omeprazole (PRILOSEC) 40 MG capsule Take 40 mg by mouth daily at 6 (six) AM.    potassium chloride SA (KLOR-CON) 20 MEQ tablet Take 20 mEq by mouth daily.   rosuvastatin (CRESTOR) 10 MG tablet Take 10 mg by mouth daily at 6 (six) AM.     Allergies:   Patient has no known allergies.   Social History   Socioeconomic History   Marital status: Married    Spouse name: Not on file   Number of children: Not on file   Years of education: Not on file   Highest education level: Not on file  Occupational History   Not on file  Tobacco Use   Smoking status: Never   Smokeless tobacco: Never  Substance and Sexual Activity   Alcohol use: Not Currently   Drug use: Never   Sexual activity: Not on file  Other Topics Concern   Not  on file  Social History Narrative   Not on file   Social Determinants of Health   Financial Resource Strain: Not on file  Food Insecurity: Not on file  Transportation Needs: Not on file  Physical Activity: Not on file  Stress: Not on file  Social Connections: Not on file     Family History: The patient's family history includes Alzheimer's disease in her maternal grandmother; COPD in her father; Heart disease in her mother; Kidney failure in her mother; Lung cancer in her father, maternal grandfather, and paternal grandfather. ROS:   Please see the history of present illness.    All 14 point review of systems negative  except as described per history of present illness  EKGs/Labs/Other Studies Reviewed:      Recent Labs: 10/01/2022: ALT 14; BUN 28; Creatinine, Ser 1.23; Hemoglobin 12.9; Platelets 291; Potassium 4.2; Sodium 142; TSH 5.390  Recent Lipid Panel    Component Value Date/Time   CHOL 150 10/01/2022 0932   TRIG 109 10/01/2022 0932   HDL 52 10/01/2022 0932   CHOLHDL 2.9 10/01/2022 0932   LDLCALC 78 10/01/2022 0932    Physical Exam:    VS:  BP (!) 142/70 (BP Location: Left Arm, Patient Position: Sitting)   Pulse 93   Ht 5' (1.524 m)   Wt 236 lb 6.4 oz (107.2 kg)   SpO2 96%   BMI 46.17 kg/m     Wt Readings from Last 3 Encounters:  04/03/23 236 lb 6.4 oz (107.2 kg)  02/18/23 235 lb (106.6 kg)  10/01/22 240 lb (108.9 kg)     GEN:  Well nourished, well developed in no acute distress HEENT: Normal NECK: No JVD; No carotid bruits LYMPHATICS: No lymphadenopathy CARDIAC: RRR, no murmurs, no rubs, no gallops RESPIRATORY:  Clear to auscultation without rales, wheezing or rhonchi  ABDOMEN: Soft, non-tender, non-distended MUSCULOSKELETAL:  No edema; No deformity  SKIN: Warm and dry LOWER EXTREMITIES: no swelling NEUROLOGIC:  Alert and oriented x 3 PSYCHIATRIC:  Normal affect   ASSESSMENT:    1. CHB (complete heart block)   2. Essential hypertension   3. Dyspnea on exertion   4. S/P placement of cardiac pacemaker, medtronic 10/14/20    PLAN:    In order of problems listed above:  Complete heart block that being addressed with pacemaker, Essential hypertension still uncontrolled I will increase dose of hydralazine to 50 mg 3 times daily will continue monitoring. Dyspnea exertion multifactorial asking to be a little more active. Dyslipidemia I did review K PN I have data from October 2023 with HDL 52 LDL 78 she is on Crestor 10 which I will continue. Status post pacemaker implantation patient Medtronic device, I did review interrogation normal function continue present  management   Medication Adjustments/Labs and Tests Ordered: Current medicines are reviewed at length with the patient today.  Concerns regarding medicines are outlined above.  No orders of the defined types were placed in this encounter.  Medication changes: No orders of the defined types were placed in this encounter.   Signed, Georgeanna Lea, MD, [facility] 04/03/2023 9:08 AM    [facility]

## 2023-04-17 ENCOUNTER — Ambulatory Visit (INDEPENDENT_AMBULATORY_CARE_PROVIDER_SITE_OTHER): Payer: No Typology Code available for payment source

## 2023-04-17 DIAGNOSIS — I442 Atrioventricular block, complete: Secondary | ICD-10-CM | POA: Diagnosis not present

## 2023-04-18 LAB — CUP PACEART REMOTE DEVICE CHECK
Battery Remaining Longevity: 116 mo
Battery Voltage: 3.01 V
Brady Statistic AP VP Percent: 18.45 %
Brady Statistic AP VS Percent: 0 %
Brady Statistic AS VP Percent: 81.18 %
Brady Statistic AS VS Percent: 0.36 %
Brady Statistic RA Percent Paced: 18.25 %
Brady Statistic RV Percent Paced: 99.63 %
Date Time Interrogation Session: 20240502012228
Implantable Lead Connection Status: 753985
Implantable Lead Connection Status: 753985
Implantable Lead Implant Date: 20211029
Implantable Lead Implant Date: 20211029
Implantable Lead Location: 753859
Implantable Lead Location: 753860
Implantable Lead Model: 5076
Implantable Lead Model: 5076
Implantable Pulse Generator Implant Date: 20211029
Lead Channel Impedance Value: 304 Ohm
Lead Channel Impedance Value: 323 Ohm
Lead Channel Impedance Value: 399 Ohm
Lead Channel Impedance Value: 399 Ohm
Lead Channel Pacing Threshold Amplitude: 0.75 V
Lead Channel Pacing Threshold Amplitude: 0.875 V
Lead Channel Pacing Threshold Pulse Width: 0.4 ms
Lead Channel Pacing Threshold Pulse Width: 0.4 ms
Lead Channel Sensing Intrinsic Amplitude: 14 mV
Lead Channel Sensing Intrinsic Amplitude: 14 mV
Lead Channel Sensing Intrinsic Amplitude: 2.75 mV
Lead Channel Sensing Intrinsic Amplitude: 2.75 mV
Lead Channel Setting Pacing Amplitude: 1.75 V
Lead Channel Setting Pacing Amplitude: 2 V
Lead Channel Setting Pacing Pulse Width: 0.4 ms
Lead Channel Setting Sensing Sensitivity: 1.2 mV
Zone Setting Status: 755011
Zone Setting Status: 755011

## 2023-05-08 NOTE — Progress Notes (Signed)
Remote pacemaker transmission.   

## 2023-05-15 ENCOUNTER — Other Ambulatory Visit: Payer: Self-pay | Admitting: Cardiology

## 2023-07-17 ENCOUNTER — Ambulatory Visit (INDEPENDENT_AMBULATORY_CARE_PROVIDER_SITE_OTHER): Payer: No Typology Code available for payment source

## 2023-07-17 DIAGNOSIS — I442 Atrioventricular block, complete: Secondary | ICD-10-CM

## 2023-07-17 LAB — CUP PACEART REMOTE DEVICE CHECK
Battery Remaining Longevity: 113 mo
Battery Voltage: 3.01 V
Brady Statistic AP VP Percent: 27.73 %
Brady Statistic AP VS Percent: 0.01 %
Brady Statistic AS VP Percent: 71.53 %
Brady Statistic AS VS Percent: 0.73 %
Brady Statistic RA Percent Paced: 27.69 %
Brady Statistic RV Percent Paced: 99.26 %
Date Time Interrogation Session: 20240731001835
Implantable Lead Connection Status: 753985
Implantable Lead Connection Status: 753985
Implantable Lead Implant Date: 20211029
Implantable Lead Implant Date: 20211029
Implantable Lead Location: 753859
Implantable Lead Location: 753860
Implantable Lead Model: 5076
Implantable Lead Model: 5076
Implantable Pulse Generator Implant Date: 20211029
Lead Channel Impedance Value: 304 Ohm
Lead Channel Impedance Value: 304 Ohm
Lead Channel Impedance Value: 380 Ohm
Lead Channel Impedance Value: 399 Ohm
Lead Channel Pacing Threshold Amplitude: 0.75 V
Lead Channel Pacing Threshold Amplitude: 0.875 V
Lead Channel Pacing Threshold Pulse Width: 0.4 ms
Lead Channel Pacing Threshold Pulse Width: 0.4 ms
Lead Channel Sensing Intrinsic Amplitude: 14 mV
Lead Channel Sensing Intrinsic Amplitude: 14 mV
Lead Channel Sensing Intrinsic Amplitude: 2.625 mV
Lead Channel Sensing Intrinsic Amplitude: 2.625 mV
Lead Channel Setting Pacing Amplitude: 1.5 V
Lead Channel Setting Pacing Amplitude: 2 V
Lead Channel Setting Pacing Pulse Width: 0.4 ms
Lead Channel Setting Sensing Sensitivity: 1.2 mV
Zone Setting Status: 755011
Zone Setting Status: 755011

## 2023-07-18 ENCOUNTER — Telehealth: Payer: Self-pay | Admitting: *Deleted

## 2023-07-18 NOTE — Telephone Encounter (Signed)
-----   Message from [facility] sent at 07/17/2023  1:17 PM EDT ----- Abnormal device interrogation reviewed.  Lead parameters and battery status stable.  Nsvt. Start toprol xl 50 mg

## 2023-07-18 NOTE — Telephone Encounter (Signed)
Left message to call back  

## 2023-07-31 NOTE — Progress Notes (Signed)
Remote pacemaker transmission.   

## 2023-08-30 NOTE — Telephone Encounter (Signed)
Left message to call back  

## 2023-10-16 ENCOUNTER — Ambulatory Visit (INDEPENDENT_AMBULATORY_CARE_PROVIDER_SITE_OTHER): Payer: No Typology Code available for payment source

## 2023-10-16 DIAGNOSIS — I442 Atrioventricular block, complete: Secondary | ICD-10-CM

## 2023-10-16 LAB — CUP PACEART REMOTE DEVICE CHECK
Battery Remaining Longevity: 108 mo
Battery Voltage: 3.01 V
Brady Statistic AP VP Percent: 18.01 %
Brady Statistic AP VS Percent: 0 %
Brady Statistic AS VP Percent: 80.81 %
Brady Statistic AS VS Percent: 1.18 %
Brady Statistic RA Percent Paced: 18.02 %
Brady Statistic RV Percent Paced: 98.81 %
Date Time Interrogation Session: 20241029223323
Implantable Lead Connection Status: 753985
Implantable Lead Connection Status: 753985
Implantable Lead Implant Date: 20211029
Implantable Lead Implant Date: 20211029
Implantable Lead Location: 753859
Implantable Lead Location: 753860
Implantable Lead Model: 5076
Implantable Lead Model: 5076
Implantable Pulse Generator Implant Date: 20211029
Lead Channel Impedance Value: 304 Ohm
Lead Channel Impedance Value: 304 Ohm
Lead Channel Impedance Value: 380 Ohm
Lead Channel Impedance Value: 399 Ohm
Lead Channel Pacing Threshold Amplitude: 0.75 V
Lead Channel Pacing Threshold Amplitude: 0.875 V
Lead Channel Pacing Threshold Pulse Width: 0.4 ms
Lead Channel Pacing Threshold Pulse Width: 0.4 ms
Lead Channel Sensing Intrinsic Amplitude: 14 mV
Lead Channel Sensing Intrinsic Amplitude: 14 mV
Lead Channel Sensing Intrinsic Amplitude: 2.5 mV
Lead Channel Sensing Intrinsic Amplitude: 2.5 mV
Lead Channel Setting Pacing Amplitude: 1.75 V
Lead Channel Setting Pacing Amplitude: 2 V
Lead Channel Setting Pacing Pulse Width: 0.4 ms
Lead Channel Setting Sensing Sensitivity: 1.2 mV
Zone Setting Status: 755011
Zone Setting Status: 755011

## 2023-11-04 NOTE — Progress Notes (Signed)
Remote pacemaker transmission.   

## 2023-11-05 DIAGNOSIS — R7303 Prediabetes: Secondary | ICD-10-CM | POA: Insufficient documentation

## 2023-11-05 DIAGNOSIS — M25512 Pain in left shoulder: Secondary | ICD-10-CM | POA: Insufficient documentation

## 2024-01-01 NOTE — Progress Notes (Signed)
Cardiology Office Note:  .   Date:  01/02/2024  ID:  Cynthia Archer, DOB 08/24/1961, MRN [id number] PCP: Erskine Emery, NP  [facility] Providers Cardiologist:  Gypsy Balsam, MD Electrophysiologist:  Regan Lemming, MD    History of Present Illness: .   Cynthia Archer is a 63 y.o. female with a past medical history of hypertension, complete heart block with presence of PPM, hyperparathyroidism, NSVT.  10/16/2023 device check 1 episode of NSVT 04/06/2022 echo EF 60 to 65%, mild LVH, grade 2 DD, LA moderately dilated 10/14/2020 ppm implantation  Most recently evaluated by Dr. Bing Matter on 04/03/2023, was doing well from a cardiac perspective, had some complaints of DOE which was felt to be multifactorial with recommendations to increase physical activity.  She presents today for follow-up of her hypertension.  Her blood pressure is elevated in the office today however she stated it was well-controlled at home.  Her heart rate was also initially elevated however if she had time to settle down it was 90 bpm.  Discussed that at her previous device interrogation she had episodes of NSVT, it was recommended that she start metoprolol however she states nobody advised her that and she is subsequently not started it.  We did discuss that we could start that today, could help with her elevated resting heart rate as well as episodes of NSVT however she politely declines, not interested in taking any other medications. She denies chest pain, palpitations, dyspnea, pnd, orthopnea, n, v, dizziness, syncope, edema, weight gain, or early satiety.   ROS: Review of Systems  All other systems reviewed and are negative.    Studies Reviewed: .        Cardiac Studies & Procedures      ECHOCARDIOGRAM  ECHOCARDIOGRAM COMPLETE 04/06/2022  Narrative ECHOCARDIOGRAM REPORT    Patient Name:   Cynthia Archer Date of Exam: 04/06/2022 Medical Rec #:  [id number]       Height:       60.0  in Accession #:    [id number]      Weight:       236.6 lb Date of Birth:  [birth year]        BSA:          2.005 m Patient Age:    61 years        BP:           146/72 mmHg Patient Gender: F               HR:           60 bpm. Exam Location:  [place]  Procedure: 2D Echo, Cardiac Doppler, Color Doppler and Strain Analysis  Indications:    Essential hypertension [I10 (ICD-10-CM)]; Second degree AV block [I44.1 (ICD-10-CM)]; S/P placement of cardiac pacemaker, medtronic 10/14/20 [Z95.0 (ICD-10-CM)]; Morbid obesity (HCC) [E66.01 (ICD-10-CM)]; Dyspnea on exertion [R06.09 (ICD-10-CM)]  History:        Patient has prior history of Echocardiogram examinations, most recent 09/27/2020. Arrythmias:CHB; Risk Factors:Hypertension.  Sonographer:    Margreta Journey RDCS Referring Phys: [id number] Georgeanna Lea   Sonographer Comments: Image acquisition challenging due to patient body habitus. IMPRESSIONS   1. GLS -18.6. Left ventricular ejection fraction, by estimation, is 60 to 65%. The left ventricle has normal function. The left ventricle has no regional wall motion abnormalities. There is mild left ventricular hypertrophy. Left ventricular diastolic parameters are consistent with Grade II diastolic dysfunction (pseudonormalization). 2. Right ventricular systolic function is  normal. The right ventricular size is normal. 3. Left atrial size was moderately dilated. 4. The mitral valve is normal in structure. No evidence of mitral valve regurgitation. No evidence of mitral stenosis. 5. The aortic valve is normal in structure. Aortic valve regurgitation is not visualized. No aortic stenosis is present. 6. The inferior vena cava is normal in size with greater than 50% respiratory variability, suggesting right atrial pressure of 3 mmHg.  FINDINGS Left Ventricle: GLS -18.6. Left ventricular ejection fraction, by estimation, is 60 to 65%. The left ventricle has normal function. The left ventricle has  no regional wall motion abnormalities. The left ventricular internal cavity size was normal in size. There is mild left ventricular hypertrophy. Left ventricular diastolic parameters are consistent with Grade II diastolic dysfunction (pseudonormalization).  Right Ventricle: The right ventricular size is normal. No increase in right ventricular wall thickness. Right ventricular systolic function is normal.  Left Atrium: Left atrial size was moderately dilated.  Right Atrium: Right atrial size was normal in size.  Pericardium: There is no evidence of pericardial effusion.  Mitral Valve: The mitral valve is normal in structure. No evidence of mitral valve regurgitation. No evidence of mitral valve stenosis.  Tricuspid Valve: The tricuspid valve is normal in structure. Tricuspid valve regurgitation is not demonstrated. No evidence of tricuspid stenosis.  Aortic Valve: The aortic valve is normal in structure. Aortic valve regurgitation is not visualized. No aortic stenosis is present.  Pulmonic Valve: The pulmonic valve was normal in structure. Pulmonic valve regurgitation is not visualized. No evidence of pulmonic stenosis.  Aorta: The aortic root is normal in size and structure.  Venous: The inferior vena cava is normal in size with greater than 50% respiratory variability, suggesting right atrial pressure of 3 mmHg.  IAS/Shunts: No atrial level shunt detected by color flow Doppler.  Additional Comments: A device lead is visualized.   LEFT VENTRICLE PLAX 2D LVIDd:         5.30 cm   Diastology LVIDs:         3.30 cm   LV e' medial:    8.92 cm/s LV PW:         1.20 cm   LV E/e' medial:  14.5 LV IVS:        1.40 cm   LV e' lateral:   12.10 cm/s LVOT diam:     2.10 cm   LV E/e' lateral: 10.7 LV SV:         96 LV SV Index:   48 LVOT Area:     3.46 cm   RIGHT VENTRICLE             IVC RV Basal diam:  3.50 cm     IVC diam: 1.90 cm RV Mid diam:    2.70 cm RV S prime:     11.90  cm/s TAPSE (M-mode): 2.6 cm  LEFT ATRIUM             Index        RIGHT ATRIUM           Index LA diam:        4.00 cm 2.00 cm/m   RA Area:     13.10 cm LA Vol (A2C):   59.0 ml 29.43 ml/m  RA Volume:   32.70 ml  16.31 ml/m LA Vol (A4C):   85.8 ml 42.80 ml/m LA Biplane Vol: 71.7 ml 35.76 ml/m AORTIC VALVE LVOT Vmax:   128.00 cm/s LVOT Vmean:  87.600  cm/s LVOT VTI:    0.277 m  AORTA Ao Root diam: 3.40 cm Ao Asc diam:  3.50 cm Ao Desc diam: 2.10 cm  MITRAL VALVE MV Area (PHT): 4.15 cm     SHUNTS MV Decel Time: 183 msec     Systemic VTI:  0.28 m MV E velocity: 129.00 cm/s  Systemic Diam: 2.10 cm MV A velocity: 99.40 cm/s MV E/A ratio:  1.30  Gypsy Balsam MD Electronically signed by Gypsy Balsam MD Signature Date/Time: 04/06/2022/12:28:02 PM    Final   MONITORS  CARDIAC EVENT MONITOR 10/23/2021           Risk Assessment/Calculations:          Physical Exam:   VS:  BP (!) 162/98   Pulse 90   Ht 5' (1.524 m)   Wt 238 lb (108 kg)   SpO2 94%   BMI 46.48 kg/m    Wt Readings from Last 3 Encounters:  01/02/24 238 lb (108 kg)  04/03/23 236 lb 6.4 oz (107.2 kg)  02/18/23 235 lb (106.6 kg)    GEN: Well nourished, well developed in no acute distress NECK: No JVD; No carotid bruits CARDIAC: RRR, no murmurs, rubs, gallops RESPIRATORY:  Clear to auscultation without rales, wheezing or rhonchi  ABDOMEN: Soft, non-tender, non-distended EXTREMITIES:  No edema; No deformity   ASSESSMENT AND PLAN: .   Complete heart block/presence of PPM-followed by EP, previous device interrogation revealed episodes of NSVT and the recommendation was she start metoprolol however she did not start this.  Advised we could start it today but she politely declines, she would like to see what her next device interrogation reveals.  Hypertension-her blood pressure is elevated in the office today at 162/98 however stated it was well-controlled at home this morning.  She will keep a  blood pressure log at home and if her blood pressure continues to be elevated she will notify us.  Continue Norvasc 10 mg daily, continue Apresoline 50 mg 3 times daily.  Dyslipidemia-will repeat LFTs and direct LDL today, continue Crestor 10 mg daily.  Obesity-BMI 46, Heart healthy diet and regular cardiovascular exercise encouraged.         Dispo: CMET, direct LDL today.  Follow-up with general cardiology in 9 months.  Signed, Flossie Dibble, NP

## 2024-01-02 ENCOUNTER — Encounter: Payer: Self-pay | Admitting: Cardiology

## 2024-01-02 ENCOUNTER — Ambulatory Visit: Payer: 59 | Attending: Cardiology | Admitting: Cardiology

## 2024-01-02 VITALS — BP 162/98 | HR 90 | Ht 60.0 in | Wt 238.0 lb

## 2024-01-02 DIAGNOSIS — G4733 Obstructive sleep apnea (adult) (pediatric): Secondary | ICD-10-CM

## 2024-01-02 DIAGNOSIS — E66813 Obesity, class 3: Secondary | ICD-10-CM

## 2024-01-02 DIAGNOSIS — I10 Essential (primary) hypertension: Secondary | ICD-10-CM

## 2024-01-02 DIAGNOSIS — Z950 Presence of cardiac pacemaker: Secondary | ICD-10-CM | POA: Diagnosis not present

## 2024-01-02 DIAGNOSIS — Z6841 Body Mass Index (BMI) 40.0 and over, adult: Secondary | ICD-10-CM | POA: Diagnosis not present

## 2024-01-02 DIAGNOSIS — I442 Atrioventricular block, complete: Secondary | ICD-10-CM | POA: Diagnosis not present

## 2024-01-02 NOTE — Patient Instructions (Signed)
Medication Instructions:  Your physician recommends that you continue on your current medications as directed. Please refer to the Current Medication list given to you today.  *If you need a refill on your cardiac medications before your next appointment, please call your pharmacy*   Lab Work: Your physician recommends that you return for lab work in: Today for CMP and Direct LDL  If you have labs (blood work) drawn today and your tests are completely normal, you will receive your results only by: MyChart Message (if you have MyChart) OR A paper copy in the mail If you have any lab test that is abnormal or we need to change your treatment, we will call you to review the results.   Testing/Procedures: NONE   Follow-Up: At [facility], you and your health needs are our priority.  As part of our continuing mission to provide you with exceptional heart care, we have created designated Provider Care Teams.  These Care Teams include your primary Cardiologist (physician) and Advanced Practice Providers (APPs -  Physician Assistants and Nurse Practitioners) who all work together to provide you with the care you need, when you need it.  We recommend signing up for the patient portal called "MyChart".  Sign up information is provided on this After Visit Summary.  MyChart is used to connect with patients for Virtual Visits (Telemedicine).  Patients are able to view lab/test results, encounter notes, upcoming appointments, etc.  Non-urgent messages can be sent to your provider as well.   To learn more about what you can do with MyChart, go to [email or web address].    Your next appointment:   9 month(s)  Provider:   Gypsy Balsam, MD    Other Instructions

## 2024-01-03 ENCOUNTER — Telehealth: Payer: Self-pay | Admitting: Emergency Medicine

## 2024-01-03 LAB — COMPREHENSIVE METABOLIC PANEL WITH GFR
ALT: 15 IU/L (ref 0–32)
AST: 18 IU/L (ref 0–40)
Albumin: 4.4 g/dL (ref 3.9–4.9)
Alkaline Phosphatase: 116 IU/L (ref 44–121)
BUN/Creatinine Ratio: 23 (ref 12–28)
BUN: 25 mg/dL (ref 8–27)
Bilirubin Total: 0.3 mg/dL (ref 0.0–1.2)
CO2: 24 mmol/L (ref 20–29)
Calcium: 10.4 mg/dL — ABNORMAL HIGH (ref 8.7–10.3)
Chloride: 101 mmol/L (ref 96–106)
Creatinine, Ser: 1.11 mg/dL — ABNORMAL HIGH (ref 0.57–1.00)
Globulin, Total: 2.6 g/dL (ref 1.5–4.5)
Glucose: 105 mg/dL — ABNORMAL HIGH (ref 70–99)
Potassium: 4.3 mmol/L (ref 3.5–5.2)
Sodium: 140 mmol/L (ref 134–144)
Total Protein: 7 g/dL (ref 6.0–8.5)
eGFR: 56 mL/min/1.73 — ABNORMAL LOW

## 2024-01-03 LAB — LDL CHOLESTEROL, DIRECT: LDL Direct: 76 mg/dL (ref 0–99)

## 2024-01-03 NOTE — Telephone Encounter (Signed)
Results reviewed with pt as per Wallis Bamberg NP's note.  Pt verbalized understanding and had no additional questions. Routed to PCP.

## 2024-01-03 NOTE — Telephone Encounter (Signed)
-----   Message from Flossie Dibble sent at 01/03/2024  7:39 AM EST ----- Labs are good, cholesterol is good.

## 2024-01-15 ENCOUNTER — Ambulatory Visit (INDEPENDENT_AMBULATORY_CARE_PROVIDER_SITE_OTHER): Payer: Commercial Managed Care - PPO

## 2024-01-15 DIAGNOSIS — I442 Atrioventricular block, complete: Secondary | ICD-10-CM

## 2024-01-15 LAB — CUP PACEART REMOTE DEVICE CHECK
Battery Remaining Longevity: 105 mo
Battery Voltage: 3.01 V
Brady Statistic AP VP Percent: 31.66 %
Brady Statistic AP VS Percent: 0.01 %
Brady Statistic AS VP Percent: 66.87 %
Brady Statistic AS VS Percent: 1.46 %
Brady Statistic RA Percent Paced: 31.8 %
Brady Statistic RV Percent Paced: 98.53 %
Date Time Interrogation Session: 20250129001310
Implantable Lead Connection Status: 753985
Implantable Lead Connection Status: 753985
Implantable Lead Implant Date: 20211029
Implantable Lead Implant Date: 20211029
Implantable Lead Location: 753859
Implantable Lead Location: 753860
Implantable Lead Model: 5076
Implantable Lead Model: 5076
Implantable Pulse Generator Implant Date: 20211029
Lead Channel Impedance Value: 304 Ohm
Lead Channel Impedance Value: 304 Ohm
Lead Channel Impedance Value: 380 Ohm
Lead Channel Impedance Value: 399 Ohm
Lead Channel Pacing Threshold Amplitude: 0.75 V
Lead Channel Pacing Threshold Amplitude: 1 V
Lead Channel Pacing Threshold Pulse Width: 0.4 ms
Lead Channel Pacing Threshold Pulse Width: 0.4 ms
Lead Channel Sensing Intrinsic Amplitude: 1.75 mV
Lead Channel Sensing Intrinsic Amplitude: 1.75 mV
Lead Channel Sensing Intrinsic Amplitude: 15 mV
Lead Channel Sensing Intrinsic Amplitude: 15 mV
Lead Channel Setting Pacing Amplitude: 1.5 V
Lead Channel Setting Pacing Amplitude: 2 V
Lead Channel Setting Pacing Pulse Width: 0.4 ms
Lead Channel Setting Sensing Sensitivity: 1.2 mV
Zone Setting Status: 755011
Zone Setting Status: 755011

## 2024-02-25 NOTE — Progress Notes (Signed)
 Remote pacemaker transmission.

## 2024-04-15 ENCOUNTER — Ambulatory Visit (INDEPENDENT_AMBULATORY_CARE_PROVIDER_SITE_OTHER): Payer: Commercial Managed Care - PPO

## 2024-04-15 DIAGNOSIS — I442 Atrioventricular block, complete: Secondary | ICD-10-CM

## 2024-04-16 ENCOUNTER — Telehealth: Payer: Self-pay

## 2024-04-16 LAB — CUP PACEART REMOTE DEVICE CHECK
Battery Remaining Longevity: 102 mo
Battery Voltage: 3 V
Brady Statistic AP VP Percent: 24.71 %
Brady Statistic AP VS Percent: 0 %
Brady Statistic AS VP Percent: 73.41 %
Brady Statistic AS VS Percent: 1.88 %
Brady Statistic RA Percent Paced: 24.87 %
Brady Statistic RV Percent Paced: 98.12 %
Date Time Interrogation Session: 20250429222430
Implantable Lead Connection Status: 753985
Implantable Lead Connection Status: 753985
Implantable Lead Implant Date: 20211029
Implantable Lead Implant Date: 20211029
Implantable Lead Location: 753859
Implantable Lead Location: 753860
Implantable Lead Model: 5076
Implantable Lead Model: 5076
Implantable Pulse Generator Implant Date: 20211029
Lead Channel Impedance Value: 304 Ohm
Lead Channel Impedance Value: 323 Ohm
Lead Channel Impedance Value: 380 Ohm
Lead Channel Impedance Value: 380 Ohm
Lead Channel Pacing Threshold Amplitude: 0.875 V
Lead Channel Pacing Threshold Amplitude: 0.875 V
Lead Channel Pacing Threshold Pulse Width: 0.4 ms
Lead Channel Pacing Threshold Pulse Width: 0.4 ms
Lead Channel Sensing Intrinsic Amplitude: 1.625 mV
Lead Channel Sensing Intrinsic Amplitude: 1.625 mV
Lead Channel Sensing Intrinsic Amplitude: 12.25 mV
Lead Channel Sensing Intrinsic Amplitude: 12.25 mV
Lead Channel Setting Pacing Amplitude: 1.75 V
Lead Channel Setting Pacing Amplitude: 2 V
Lead Channel Setting Pacing Pulse Width: 0.4 ms
Lead Channel Setting Sensing Sensitivity: 1.2 mV
Zone Setting Status: 755011
Zone Setting Status: 755011

## 2024-04-16 NOTE — Telephone Encounter (Signed)
 Will await MD input......Cynthia Archer urgency for appt need? event occurred back in early March

## 2024-04-16 NOTE — Telephone Encounter (Signed)
 Pt returning call, requesting cb

## 2024-04-16 NOTE — Telephone Encounter (Signed)
 Note VT was in early March. Patient denies any symptoms. Pt is due to OV w/ EP. EGM can be reviewed at OV.

## 2024-04-16 NOTE — Telephone Encounter (Signed)
 Alert received from [employer] for 1 monitored VT event 3/3 @ 20:15, 8sec in duration, HR 200, > 20 beats.  Attempted to contact patient. No answer, left message to call back.

## 2024-04-21 MED ORDER — METOPROLOL SUCCINATE ER 50 MG PO TB24: 50.0000 mg | ORAL_TABLET | Freq: Every day | ORAL | 3 refills | Status: DC

## 2024-04-21 NOTE — Telephone Encounter (Signed)
 Pt made aware and agreeable to plan.  Metoprolol precautions reviewed with patient and advised when to call office to discuss. Advised to keep track of her BP/HR over the next several weeks and to call if issue/s need to be addressed.   Aware [place] office will contact her to arrange overdue follow up.  She is agreeable.

## 2024-04-21 NOTE — Telephone Encounter (Signed)
 Patient scheduled.

## 2024-04-22 ENCOUNTER — Other Ambulatory Visit: Payer: Self-pay | Admitting: Cardiology

## 2024-04-22 NOTE — Telephone Encounter (Signed)
 Prescription sent to pharmacy.

## 2024-05-03 DIAGNOSIS — K219 Gastro-esophageal reflux disease without esophagitis: Secondary | ICD-10-CM | POA: Insufficient documentation

## 2024-05-03 DIAGNOSIS — E785 Hyperlipidemia, unspecified: Secondary | ICD-10-CM | POA: Insufficient documentation

## 2024-05-03 DIAGNOSIS — M109 Gout, unspecified: Secondary | ICD-10-CM | POA: Insufficient documentation

## 2024-05-03 DIAGNOSIS — G629 Polyneuropathy, unspecified: Secondary | ICD-10-CM | POA: Insufficient documentation

## 2024-05-04 DIAGNOSIS — R809 Proteinuria, unspecified: Secondary | ICD-10-CM | POA: Insufficient documentation

## 2024-05-04 DIAGNOSIS — B351 Tinea unguium: Secondary | ICD-10-CM | POA: Insufficient documentation

## 2024-05-04 DIAGNOSIS — M898X1 Other specified disorders of bone, shoulder: Secondary | ICD-10-CM | POA: Insufficient documentation

## 2024-05-04 DIAGNOSIS — I10 Essential (primary) hypertension: Secondary | ICD-10-CM | POA: Diagnosis not present

## 2024-05-04 DIAGNOSIS — H9392 Unspecified disorder of left ear: Secondary | ICD-10-CM | POA: Insufficient documentation

## 2024-05-20 ENCOUNTER — Other Ambulatory Visit: Payer: Self-pay | Admitting: Cardiology

## 2024-05-28 NOTE — Progress Notes (Signed)
 Remote pacemaker transmission.

## 2024-05-28 NOTE — Addendum Note (Signed)
 Addended by: Lott Rouleau A on: 05/28/2024 01:41 PM   Modules accepted: Orders

## 2024-07-15 ENCOUNTER — Ambulatory Visit (INDEPENDENT_AMBULATORY_CARE_PROVIDER_SITE_OTHER): Payer: Commercial Managed Care - PPO

## 2024-07-15 DIAGNOSIS — I442 Atrioventricular block, complete: Secondary | ICD-10-CM

## 2024-07-15 LAB — CUP PACEART REMOTE DEVICE CHECK
Battery Remaining Longevity: 100 mo
Battery Voltage: 3 V
Brady Statistic AP VP Percent: 61.89 %
Brady Statistic AP VS Percent: 0 %
Brady Statistic AS VP Percent: 37.88 %
Brady Statistic AS VS Percent: 0.23 %
Brady Statistic RA Percent Paced: 61.88 %
Brady Statistic RV Percent Paced: 99.77 %
Date Time Interrogation Session: 20250730001923
Implantable Lead Connection Status: 753985
Implantable Lead Connection Status: 753985
Implantable Lead Implant Date: 20211029
Implantable Lead Implant Date: 20211029
Implantable Lead Location: 753859
Implantable Lead Location: 753860
Implantable Lead Model: 5076
Implantable Lead Model: 5076
Implantable Pulse Generator Implant Date: 20211029
Lead Channel Impedance Value: 323 Ohm
Lead Channel Impedance Value: 323 Ohm
Lead Channel Impedance Value: 380 Ohm
Lead Channel Impedance Value: 399 Ohm
Lead Channel Pacing Threshold Amplitude: 0.875 V
Lead Channel Pacing Threshold Amplitude: 0.875 V
Lead Channel Pacing Threshold Pulse Width: 0.4 ms
Lead Channel Pacing Threshold Pulse Width: 0.4 ms
Lead Channel Sensing Intrinsic Amplitude: 12.25 mV
Lead Channel Sensing Intrinsic Amplitude: 12.25 mV
Lead Channel Sensing Intrinsic Amplitude: 2.5 mV
Lead Channel Sensing Intrinsic Amplitude: 2.5 mV
Lead Channel Setting Pacing Amplitude: 1.75 V
Lead Channel Setting Pacing Amplitude: 2 V
Lead Channel Setting Pacing Pulse Width: 0.4 ms
Lead Channel Setting Sensing Sensitivity: 1.2 mV
Zone Setting Status: 755011
Zone Setting Status: 755011

## 2024-07-18 ENCOUNTER — Ambulatory Visit: Payer: Self-pay | Admitting: Cardiology

## 2024-08-25 ENCOUNTER — Other Ambulatory Visit: Payer: Self-pay | Admitting: Cardiology

## 2024-09-05 NOTE — Progress Notes (Unsigned)
  Electrophysiology Office Note:   Date:  09/07/2024  ID:  Cynthia Archer, DOB [birth year], MRN [id number]  Primary Cardiologist: Cynthia Fitch, MD Primary Heart Failure: None Electrophysiologist: Cynthia Archer Cynthia Norton, MD      History of Present Illness:   Cynthia Archer is a 63 y.o. female with h/o hypertension, hypothyroidism, obesity, Mobitz 1 AV block seen today for routine electrophysiology followup.   Since last being seen in our clinic the patient reports doing well.  She has no chest pain or shortness of breath.  She is able to do her daily activities.  She has not noted a change in her energy since starting metoprolol .  she denies chest pain, palpitations, dyspnea, PND, orthopnea, nausea, vomiting, dizziness, syncope, edema, weight gain, or early satiety.   Review of systems complete and found to be negative unless listed in HPI.      EP Information / Studies Reviewed:    EKG is ordered today. Personal review as below.  EKG Interpretation Date/Time:  Monday September 07 2024 09:44:46 EDT Ventricular Rate:  73 PR Interval:  178 QRS Duration:  180 QT Interval:  446 QTC Calculation: 491 R Axis:   -75  Text Interpretation: Atrial-sensed ventricular-paced rhythm Abnormal ECG When compared with ECG of 15-Oct-2020 06:18, No significant change since last tracing Confirmed by Cynthia Archer ([id number]) on 09/07/2024 9:48:14 AM   PPM Interrogation-  reviewed in detail today,  See PACEART report.  Device History: Medtronic Dual Chamber PPM implanted 10/14/2020 for Second Degree AV block  Risk Assessment/Calculations:           Physical Exam:   VS:  BP 132/72   Pulse 73   Ht 5' (1.524 m)   Wt 236 lb (107 kg)   SpO2 93%   BMI 46.09 kg/m    Wt Readings from Last 3 Encounters:  09/07/24 236 lb (107 kg)  01/02/24 238 lb (108 kg)  04/03/23 236 lb 6.4 oz (107.2 kg)     GEN: Well nourished, well developed in no acute distress NECK: No JVD; No carotid bruits CARDIAC: Regular  rate and rhythm, no murmurs, rubs, gallops RESPIRATORY:  Clear to auscultation without rales, wheezing or rhonchi  ABDOMEN: Soft, non-tender, non-distended EXTREMITIES:  No edema; No deformity   ASSESSMENT AND PLAN:    Second Degree AV block s/p Medtronic PPM  Normal PPM function See Pace Art report No changes today  2.  Hypertension: Well-controlled  3.  Chronic diastolic heart failure: No volume overload  4.  Obstructive sleep apnea: CPAP compliance encouraged  5.  Nonsustained VT: None since starting metoprolol .  She is tolerating metoprolol  without issue.  No changes.  Disposition:   Follow up with Dr. Norton in 12 months  Signed, Cynthia Archer Cynthia Norton, MD

## 2024-09-07 ENCOUNTER — Encounter: Payer: Self-pay | Admitting: Cardiology

## 2024-09-07 ENCOUNTER — Ambulatory Visit: Payer: Self-pay | Admitting: Cardiology

## 2024-09-07 ENCOUNTER — Ambulatory Visit: Attending: Cardiology | Admitting: Cardiology

## 2024-09-07 VITALS — BP 132/72 | HR 73 | Ht 60.0 in | Wt 236.0 lb

## 2024-09-07 DIAGNOSIS — I10 Essential (primary) hypertension: Secondary | ICD-10-CM

## 2024-09-07 DIAGNOSIS — I472 Ventricular tachycardia, unspecified: Secondary | ICD-10-CM | POA: Diagnosis not present

## 2024-09-07 DIAGNOSIS — G4733 Obstructive sleep apnea (adult) (pediatric): Secondary | ICD-10-CM

## 2024-09-07 DIAGNOSIS — I441 Atrioventricular block, second degree: Secondary | ICD-10-CM

## 2024-09-07 DIAGNOSIS — I442 Atrioventricular block, complete: Secondary | ICD-10-CM

## 2024-09-07 LAB — CUP PACEART INCLINIC DEVICE CHECK
Date Time Interrogation Session: 20250922100810
Implantable Lead Connection Status: 753985
Implantable Lead Connection Status: 753985
Implantable Lead Implant Date: 20211029
Implantable Lead Implant Date: 20211029
Implantable Lead Location: 753859
Implantable Lead Location: 753860
Implantable Lead Model: 5076
Implantable Lead Model: 5076
Implantable Pulse Generator Implant Date: 20211029

## 2024-09-16 NOTE — Progress Notes (Signed)
 Remote PPM Transmission

## 2024-09-28 ENCOUNTER — Ambulatory Visit: Attending: Cardiology | Admitting: Cardiology

## 2024-09-28 ENCOUNTER — Encounter: Payer: Self-pay | Admitting: *Deleted

## 2024-09-28 ENCOUNTER — Encounter: Payer: Self-pay | Admitting: Cardiology

## 2024-09-28 VITALS — BP 158/84 | HR 94 | Ht 60.0 in | Wt 236.0 lb

## 2024-09-28 DIAGNOSIS — I442 Atrioventricular block, complete: Secondary | ICD-10-CM

## 2024-09-28 DIAGNOSIS — I10 Essential (primary) hypertension: Secondary | ICD-10-CM | POA: Diagnosis not present

## 2024-09-28 DIAGNOSIS — R7303 Prediabetes: Secondary | ICD-10-CM

## 2024-09-28 DIAGNOSIS — I441 Atrioventricular block, second degree: Secondary | ICD-10-CM | POA: Diagnosis not present

## 2024-09-28 DIAGNOSIS — Z950 Presence of cardiac pacemaker: Secondary | ICD-10-CM | POA: Diagnosis not present

## 2024-09-28 DIAGNOSIS — R0609 Other forms of dyspnea: Secondary | ICD-10-CM

## 2024-09-28 NOTE — Progress Notes (Signed)
 Cardiology Office Note:    Date:  09/28/2024   ID:  Cynthia Archer, DOB 14-Jul-1961, MRN [id number]  PCP:  Silvano Angeline FALCON, NP  Cardiologist:  Lamar Fitch, MD    Referring MD: Silvano Angeline FALCON, NP   Chief Complaint  Patient presents with   Follow-up  Doing fine  History of Present Illness:    Cynthia Archer is a 63 y.o. female past medical history significant for diastolic congestive heart failure, essential hypertension, complete heart block required pacemaker implantation which is Medtronic device implanted in 2021, dyslipidemia, chronic swelling of lower extremities.  Comes today to months for follow-up, cardiac wise doing well.  Denies have any chest pain tightness squeezing pressure in chest no palpitation dizziness swelling of lower extremity  Past Medical History:  Diagnosis Date   CHB (complete heart block) (HCC) 10/15/2020   Degeneration of lumbar intervertebral disc 02/08/2020   Essential hypertension 02/26/2017   Hypercalcemia 02/26/2017   Hyperparathyroidism 02/26/2017   S/P placement of cardiac pacemaker, medtronic 10/14/20 10/15/2020   Second degree AV block 10/14/2020   Symptomatic bradycardia 10/15/2020   Thyroid  disease    Vitamin D  deficiency 02/26/2017    Past Surgical History:  Procedure Laterality Date   ANKLE SURGERY     PACEMAKER IMPLANT N/A 10/14/2020   Procedure: PACEMAKER IMPLANT;  Surgeon: Inocencio Soyla Lunger, MD;  Location: [facility];  Service: Cardiovascular;  Laterality: N/A;   THYROID  SURGERY     TUBAL LIGATION      Current Medications: Current Meds  Medication Sig   acetaminophen  (TYLENOL ) 650 MG CR tablet Take 650 mg by mouth every 8 (eight) hours as needed for pain.   allopurinol (ZYLOPRIM) 300 MG tablet Take 300 mg by mouth daily.   amLODipine  (NORVASC ) 10 MG tablet TAKE 1 TABLET BY MOUTH EVERY DAY   furosemide  (LASIX ) 40 MG tablet TAKE 1 TABLET BY MOUTH EVERY DAY   hydrALAZINE  (APRESOLINE ) 50 MG tablet TAKE 1 TABLET BY  MOUTH THREE TIMES A DAY   levothyroxine  (SYNTHROID ) 50 MCG tablet Take 50 mcg by mouth daily at 6 (six) AM.    loratadine (CLARITIN) 10 MG tablet Take 10 mg by mouth daily as needed for allergies.   metoprolol  succinate (TOPROL -XL) 50 MG 24 hr tablet TAKE 1 TABLET BY MOUTH DAILY. TAKE WITH OR IMMEDIATELY FOLLOWING A MEAL.   naproxen  sodium (ALEVE ) 220 MG tablet Take 440 mg by mouth 2 (two) times daily with a meal.   nitroGLYCERIN  (NITROSTAT ) 0.4 MG SL tablet Place 1 tablet (0.4 mg total) under the tongue every 5 (five) minutes as needed for chest pain.   omeprazole (PRILOSEC) 40 MG capsule Take 40 mg by mouth daily at 6 (six) AM.    potassium chloride  SA (KLOR-CON ) 20 MEQ tablet Take 20 mEq by mouth daily.   rosuvastatin  (CRESTOR ) 10 MG tablet Take 10 mg by mouth daily at 6 (six) AM.     Allergies:   Patient has no known allergies.   Social History   Socioeconomic History   Marital status: Married    Spouse name: Not on file   Number of children: Not on file   Years of education: Not on file   Highest education level: Not on file  Occupational History   Not on file  Tobacco Use   Smoking status: Never   Smokeless tobacco: Never  Substance and Sexual Activity   Alcohol use: Not Currently   Drug use: Never   Sexual activity: Not on  file  Other Topics Concern   Not on file  Social History Narrative   Not on file   Social Drivers of Health   Financial Resource Strain: Not on file  Food Insecurity: Not on file  Transportation Needs: Not on file  Physical Activity: Not on file  Stress: Not on file  Social Connections: Not on file     Family History: The patient's family history includes Alzheimer's disease in her maternal grandmother; COPD in her father; Heart disease in her mother; Kidney failure in her mother; Lung cancer in her father, maternal grandfather, and paternal grandfather. ROS:   Please see the history of present illness.    All 14 point review of systems  negative except as described per history of present illness  EKGs/Labs/Other Studies Reviewed:         Recent Labs: 01/02/2024: ALT 15; BUN 25; Creatinine, Ser 1.11; Potassium 4.3; Sodium 140  Recent Lipid Panel    Component Value Date/Time   CHOL 150 10/01/2022 0932   TRIG 109 10/01/2022 0932   HDL 52 10/01/2022 0932   CHOLHDL 2.9 10/01/2022 0932   LDLCALC 78 10/01/2022 0932   LDLDIRECT 76 01/02/2024 1135    Physical Exam:    VS:  BP (!) 158/84   Pulse 94   Ht 5' (1.524 m)   Wt 236 lb (107 kg)   SpO2 96%   BMI 46.09 kg/m     Wt Readings from Last 3 Encounters:  09/28/24 236 lb (107 kg)  09/07/24 236 lb (107 kg)  01/02/24 238 lb (108 kg)     GEN:  Well nourished, well developed in no acute distress HEENT: Normal NECK: No JVD; No carotid bruits LYMPHATICS: No lymphadenopathy CARDIAC: RRR, no murmurs, no rubs, no gallops RESPIRATORY:  Clear to auscultation without rales, wheezing or rhonchi  ABDOMEN: Soft, non-tender, non-distended MUSCULOSKELETAL:  No edema; No deformity  SKIN: Warm and dry LOWER EXTREMITIES: no swelling NEUROLOGIC:  Alert and oriented x 3 PSYCHIATRIC:  Normal affect   ASSESSMENT:    1. CHB (complete heart block) (HCC)   2. Essential hypertension   3. Second degree AV block   4. Dyspnea on exertion   5. S/P placement of cardiac pacemaker, medtronic 10/14/20   6. Prediabetes    PLAN:    In order of problems listed above:  Complete heart block: That being addressed with pacemaker and normal function continue monitoring. Essential hypertension, blood pressure elevated.  Will increase dose of medication but she tells me that at home her blood pressure is always good.  I will ask her to check blood pressure for next 2 weeks maybe once or twice a day and bring it to me so we can verify it make sure that blood pressure is fine.  I will also schedule her to have echocardiogram to assess left ventricle ejection fraction more prominently look at the  left ventricle hypertrophy. Dyspnea exertion again echocardiogram will be done probably multifactorial, Status post pacemaker implantation normal function. Prediabetes: Followed by antimedicine team   Medication Adjustments/Labs and Tests Ordered: Current medicines are reviewed at length with the patient today.  Concerns regarding medicines are outlined above.  No orders of the defined types were placed in this encounter.  Medication changes: No orders of the defined types were placed in this encounter.   Signed, Lamar DOROTHA Fitch, MD, [facility] 09/28/2024 2:23 PM    [facility]

## 2024-09-28 NOTE — Patient Instructions (Signed)

## 2024-10-14 ENCOUNTER — Ambulatory Visit (INDEPENDENT_AMBULATORY_CARE_PROVIDER_SITE_OTHER): Payer: Commercial Managed Care - PPO

## 2024-10-14 DIAGNOSIS — I442 Atrioventricular block, complete: Secondary | ICD-10-CM

## 2024-10-15 LAB — CUP PACEART REMOTE DEVICE CHECK
Battery Remaining Longevity: 96 mo
Battery Voltage: 3 V
Brady Statistic AP VP Percent: 46.51 %
Brady Statistic AP VS Percent: 0 %
Brady Statistic AS VP Percent: 53.19 %
Brady Statistic AS VS Percent: 0.3 %
Brady Statistic RA Percent Paced: 46.55 %
Brady Statistic RV Percent Paced: 99.7 %
Date Time Interrogation Session: 20251029010614
Implantable Lead Connection Status: 753985
Implantable Lead Connection Status: 753985
Implantable Lead Implant Date: 20211029
Implantable Lead Implant Date: 20211029
Implantable Lead Location: 753859
Implantable Lead Location: 753860
Implantable Lead Model: 5076
Implantable Lead Model: 5076
Implantable Pulse Generator Implant Date: 20211029
Lead Channel Impedance Value: 323 Ohm
Lead Channel Impedance Value: 323 Ohm
Lead Channel Impedance Value: 361 Ohm
Lead Channel Impedance Value: 380 Ohm
Lead Channel Pacing Threshold Amplitude: 0.875 V
Lead Channel Pacing Threshold Amplitude: 1 V
Lead Channel Pacing Threshold Pulse Width: 0.4 ms
Lead Channel Pacing Threshold Pulse Width: 0.4 ms
Lead Channel Sensing Intrinsic Amplitude: 12.25 mV
Lead Channel Sensing Intrinsic Amplitude: 12.25 mV
Lead Channel Sensing Intrinsic Amplitude: 2.5 mV
Lead Channel Sensing Intrinsic Amplitude: 2.5 mV
Lead Channel Setting Pacing Amplitude: 1.75 V
Lead Channel Setting Pacing Amplitude: 2 V
Lead Channel Setting Pacing Pulse Width: 0.4 ms
Lead Channel Setting Sensing Sensitivity: 1.2 mV
Zone Setting Status: 755011
Zone Setting Status: 755011

## 2024-10-16 ENCOUNTER — Ambulatory Visit: Payer: Self-pay | Admitting: Cardiology

## 2024-10-16 ENCOUNTER — Ambulatory Visit: Attending: Cardiology

## 2024-10-16 DIAGNOSIS — R0609 Other forms of dyspnea: Secondary | ICD-10-CM | POA: Diagnosis not present

## 2024-10-16 LAB — ECHOCARDIOGRAM COMPLETE
AR max vel: 2.43 cm2
AV Area VTI: 2.5 cm2
AV Area mean vel: 2.42 cm2
AV Mean grad: 5 mmHg
AV Peak grad: 9.1 mmHg
Ao pk vel: 1.51 m/s
Area-P 1/2: 4.06 cm2
S' Lateral: 3.5 cm

## 2024-10-21 NOTE — Progress Notes (Signed)
 Remote PPM Transmission

## 2024-10-23 ENCOUNTER — Telehealth: Payer: Self-pay | Admitting: Cardiology

## 2024-10-23 NOTE — Telephone Encounter (Signed)
 Pt calling to f/u on results from echo 10/31. Please advise.

## 2024-10-23 NOTE — Telephone Encounter (Signed)
 Spoke with pt, advised that Dr. Krasowski had not sent any comments yet and I would send those as soon as they are available

## 2024-10-26 ENCOUNTER — Ambulatory Visit: Payer: Self-pay | Admitting: Cardiology

## 2024-11-11 NOTE — Telephone Encounter (Signed)
 Left vm to return call.

## 2024-11-11 NOTE — Telephone Encounter (Signed)
-----   Message from Lamar Fitch sent at 10/26/2024 10:57 AM EST ----- Echocardiographic preserved left ventricle ejection fraction, mild left ventricle hypertrophy, mildly dilated left atrium, mildly elevated pulm artery pressures none of this is traumatic and required  dynamic intervention anyway ----- Message ----- From: Interface, Three One Seven Sent: 10/16/2024  12:23 PM EST To: Lamar JINNY Fitch, MD

## 2024-11-24 ENCOUNTER — Other Ambulatory Visit: Payer: Self-pay | Admitting: Cardiology

## 2025-01-13 ENCOUNTER — Ambulatory Visit: Payer: Commercial Managed Care - PPO

## 2025-01-13 DIAGNOSIS — I442 Atrioventricular block, complete: Secondary | ICD-10-CM

## 2025-01-13 LAB — CUP PACEART REMOTE DEVICE CHECK
Battery Remaining Longevity: 94 mo
Battery Voltage: 2.99 V
Brady Statistic AP VP Percent: 45.6 %
Brady Statistic AP VS Percent: 0 %
Brady Statistic AS VP Percent: 53.87 %
Brady Statistic AS VS Percent: 0.53 %
Brady Statistic RA Percent Paced: 45.75 %
Brady Statistic RV Percent Paced: 99.46 %
Date Time Interrogation Session: 20260127185407
Implantable Lead Connection Status: 753985
Implantable Lead Connection Status: 753985
Implantable Lead Implant Date: 20211029
Implantable Lead Implant Date: 20211029
Implantable Lead Location: 753859
Implantable Lead Location: 753860
Implantable Lead Model: 5076
Implantable Lead Model: 5076
Implantable Pulse Generator Implant Date: 20211029
Lead Channel Impedance Value: 304 Ohm
Lead Channel Impedance Value: 323 Ohm
Lead Channel Impedance Value: 380 Ohm
Lead Channel Impedance Value: 380 Ohm
Lead Channel Pacing Threshold Amplitude: 0.875 V
Lead Channel Pacing Threshold Amplitude: 0.875 V
Lead Channel Pacing Threshold Pulse Width: 0.4 ms
Lead Channel Pacing Threshold Pulse Width: 0.4 ms
Lead Channel Sensing Intrinsic Amplitude: 12.25 mV
Lead Channel Sensing Intrinsic Amplitude: 12.25 mV
Lead Channel Sensing Intrinsic Amplitude: 2.5 mV
Lead Channel Sensing Intrinsic Amplitude: 2.5 mV
Lead Channel Setting Pacing Amplitude: 1.75 V
Lead Channel Setting Pacing Amplitude: 2 V
Lead Channel Setting Pacing Pulse Width: 0.4 ms
Lead Channel Setting Sensing Sensitivity: 1.2 mV
Zone Setting Status: 755011
Zone Setting Status: 755011

## 2025-01-14 ENCOUNTER — Ambulatory Visit: Payer: Self-pay | Admitting: Cardiology

## 2025-01-21 NOTE — Progress Notes (Signed)
 Remote PPM Transmission

## 2025-04-14 ENCOUNTER — Ambulatory Visit: Payer: Commercial Managed Care - PPO
